# Patient Record
Sex: Male | Born: 1995 | Race: Black or African American | Hispanic: No | Marital: Single | Smoking: Never smoker
Health system: Southern US, Community
[De-identification: ages and names within clinical notes are randomized; demographics above are authoritative.]

## PROBLEM LIST (undated history)

## (undated) DIAGNOSIS — F909 Attention-deficit hyperactivity disorder, unspecified type: Secondary | ICD-10-CM

---
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
## 2018-08-20 ENCOUNTER — Other Ambulatory Visit: Payer: Self-pay

## 2018-08-20 ENCOUNTER — Encounter (HOSPITAL_COMMUNITY): Payer: Self-pay | Admitting: Emergency Medicine

## 2018-08-20 ENCOUNTER — Emergency Department (HOSPITAL_COMMUNITY): Payer: Managed Care, Other (non HMO)

## 2018-08-20 ENCOUNTER — Telehealth: Payer: Self-pay | Admitting: *Deleted

## 2018-08-20 ENCOUNTER — Emergency Department (HOSPITAL_COMMUNITY)
Admission: EM | Admit: 2018-08-20 | Discharge: 2018-08-20 | Disposition: A | Discharge status: Home / Self Care | Payer: Managed Care, Other (non HMO) | Attending: Emergency Medicine | Admitting: Emergency Medicine

## 2018-08-20 DIAGNOSIS — Y999 Unspecified external cause status: Secondary | ICD-10-CM | POA: Insufficient documentation

## 2018-08-20 DIAGNOSIS — Y929 Unspecified place or not applicable: Secondary | ICD-10-CM | POA: Insufficient documentation

## 2018-08-20 DIAGNOSIS — S01512A Laceration without foreign body of oral cavity, initial encounter: Secondary | ICD-10-CM

## 2018-08-20 DIAGNOSIS — S0081XA Abrasion of other part of head, initial encounter: Secondary | ICD-10-CM

## 2018-08-20 DIAGNOSIS — S50311A Abrasion of right elbow, initial encounter: Secondary | ICD-10-CM | POA: Diagnosis not present

## 2018-08-20 DIAGNOSIS — S0031XA Abrasion of nose, initial encounter: Secondary | ICD-10-CM | POA: Insufficient documentation

## 2018-08-20 DIAGNOSIS — S022XXA Fracture of nasal bones, initial encounter for closed fracture: Secondary | ICD-10-CM

## 2018-08-20 DIAGNOSIS — Y9389 Activity, other specified: Secondary | ICD-10-CM | POA: Diagnosis not present

## 2018-08-20 HISTORY — DX: Attention-deficit hyperactivity disorder, unspecified type: F90.9

## 2018-08-20 MED ORDER — HYDROCODONE-ACETAMINOPHEN 5-325 MG PO TABS
1.0000 | ORAL_TABLET | Freq: Once | ORAL | Status: AC
  Administered 2018-08-20: 1 via ORAL
  Filled 2018-08-20: qty 1

## 2018-08-20 MED ORDER — AMOXICILLIN 500 MG PO CAPS: 500.0000 mg | ORAL_CAPSULE | Freq: Three times a day (TID) | ORAL | 0 refills | Status: DC

## 2018-08-20 MED ORDER — HYDROCODONE-ACETAMINOPHEN 5-325 MG PO TABS: 1.0000 | ORAL_TABLET | ORAL | 0 refills | Status: DC | PRN

## 2018-08-20 MED ORDER — IBUPROFEN 800 MG PO TABS
800.0000 mg | ORAL_TABLET | Freq: Once | ORAL | Status: AC
  Administered 2018-08-20: 800 mg via ORAL
  Filled 2018-08-20: qty 1

## 2018-08-20 NOTE — Telephone Encounter (Signed)
TOC CM attempted to have pt's Rx switched [facility] home. [facility]. They can transfer Amoxicillin but not narcotic. Explained to pt and [facility] [address] [facility]s. Added his pharmacy to his profile. Pt will call and schedule an appt with specialist Dr Redmond Baseman. Jonnie Finner RN CCM Case Mgmt phone [phone]

## 2018-08-20 NOTE — ED Notes (Signed)
Pt sister Claudette Head [phone] or [phone]

## 2018-08-20 NOTE — Discharge Instructions (Addendum)
Use a solution of half water, half peroxide to rinse out your mouth after you eat meals and every few hours during the day.
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
## 2018-08-20 NOTE — ED Notes (Signed)
Wound care given
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
## 2019-11-15 ENCOUNTER — Ambulatory Visit: Payer: Managed Care, Other (non HMO)

## 2019-11-24 DIAGNOSIS — F411 Generalized anxiety disorder: Secondary | ICD-10-CM | POA: Insufficient documentation

## 2019-11-24 DIAGNOSIS — D72820 Lymphocytosis (symptomatic): Secondary | ICD-10-CM | POA: Insufficient documentation

## 2020-02-25 ENCOUNTER — Encounter (HOSPITAL_BASED_OUTPATIENT_CLINIC_OR_DEPARTMENT_OTHER): Payer: Self-pay | Admitting: Emergency Medicine

## 2020-02-25 ENCOUNTER — Emergency Department (HOSPITAL_BASED_OUTPATIENT_CLINIC_OR_DEPARTMENT_OTHER): Payer: BC Managed Care – PPO

## 2020-02-25 ENCOUNTER — Emergency Department (HOSPITAL_BASED_OUTPATIENT_CLINIC_OR_DEPARTMENT_OTHER)
Admission: EM | Admit: 2020-02-25 | Discharge: 2020-02-25 | Disposition: A | Discharge status: Home / Self Care | Payer: BC Managed Care – PPO | Attending: Emergency Medicine | Admitting: Emergency Medicine

## 2020-02-25 ENCOUNTER — Other Ambulatory Visit: Payer: Self-pay

## 2020-02-25 DIAGNOSIS — M79671 Pain in right foot: Secondary | ICD-10-CM | POA: Insufficient documentation

## 2020-02-25 DIAGNOSIS — Y92410 Unspecified street and highway as the place of occurrence of the external cause: Secondary | ICD-10-CM | POA: Diagnosis not present

## 2020-02-25 DIAGNOSIS — M79672 Pain in left foot: Secondary | ICD-10-CM

## 2020-02-25 DIAGNOSIS — S7012XA Contusion of left thigh, initial encounter: Secondary | ICD-10-CM | POA: Insufficient documentation

## 2020-02-25 DIAGNOSIS — S0990XA Unspecified injury of head, initial encounter: Secondary | ICD-10-CM | POA: Diagnosis not present

## 2020-02-25 DIAGNOSIS — M79652 Pain in left thigh: Secondary | ICD-10-CM

## 2020-02-25 DIAGNOSIS — S79922A Unspecified injury of left thigh, initial encounter: Secondary | ICD-10-CM | POA: Diagnosis present

## 2020-02-25 MED ORDER — CYCLOBENZAPRINE HCL 10 MG PO TABS: 10.0000 mg | ORAL_TABLET | Freq: Every day | ORAL | 0 refills | Status: AC
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
## 2020-02-25 NOTE — Discharge Instructions (Signed)
I recommend a combination of tylenol and ibuprofen for management of your pain. You can take a low dose of both at the same time. I recommend 325 mg of Tylenol combined with 400 mg of ibuprofen. This is one regular Tylenol and two regular ibuprofen. You can take these 2-3 times for day for your pain. Please try to take these medications with a small amount of food as well to prevent upsetting your stomach.  Also, please consider topical pain relieving creams such as Voltaran Gel, BioFreeze, or Icy Hot. There is also a pain relieving cream made by [employer]. You should be able to find all of these at your local pharmacy.   I am prescribing you a strong muscle relaxer called flexeril. Please only take this medication once in the evening with dinner. This medication can make you quite drowsy. Do not mix it with alcohol. Do not drive a vehicle after taking it.   Please return to the ED with any new or worsening symptoms. It was a pleasure to meet you.

## 2020-05-25 ENCOUNTER — Encounter (HOSPITAL_BASED_OUTPATIENT_CLINIC_OR_DEPARTMENT_OTHER): Payer: Self-pay | Admitting: Emergency Medicine

## 2020-05-25 ENCOUNTER — Other Ambulatory Visit: Payer: Self-pay

## 2020-05-25 ENCOUNTER — Emergency Department (HOSPITAL_BASED_OUTPATIENT_CLINIC_OR_DEPARTMENT_OTHER): Payer: BC Managed Care – PPO

## 2020-05-25 ENCOUNTER — Emergency Department (HOSPITAL_BASED_OUTPATIENT_CLINIC_OR_DEPARTMENT_OTHER)
Admission: EM | Admit: 2020-05-25 | Discharge: 2020-05-25 | Disposition: A | Discharge status: Home / Self Care | Payer: BC Managed Care – PPO | Attending: Emergency Medicine | Admitting: Emergency Medicine

## 2020-05-25 DIAGNOSIS — R059 Cough, unspecified: Secondary | ICD-10-CM | POA: Diagnosis present

## 2020-05-25 DIAGNOSIS — J069 Acute upper respiratory infection, unspecified: Secondary | ICD-10-CM | POA: Diagnosis not present

## 2020-05-25 LAB — GROUP A STREP BY PCR: Group A Strep by PCR: NOT DETECTED

## 2020-05-25 MED ORDER — BENZONATATE 100 MG PO CAPS: 100.0000 mg | ORAL_CAPSULE | Freq: Three times a day (TID) | ORAL | 0 refills | Status: DC

## 2020-05-25 NOTE — Discharge Instructions (Addendum)
Take the medications as needed for cough.  Follow-up With a primary care doctor if the symptoms do not resolve in the next week

## 2020-05-25 NOTE — ED Provider Notes (Signed)
[facility] EMERGENCY DEPARTMENT Provider Note   CSN: [id number] Arrival date & time: 05/25/20  0734     History Chief Complaint  Patient presents with  . Cough  . Sore Throat    Randy Higgins is a 25 y.o. male.  HPI   Patient presented to the ED for evaluation of a dry cough that started yesterday.  Patient states he started coughing last night.  The symptoms persisted this morning.  He feels a tightness in his chest.  He has not had any fevers.  The cough has been nonproductive.  Patient is also worried about the possibility of strep throat as he has had a number of times in the past or he does not have a bad sore throat at this time.  He did do home COVID test and it was negative.  He has been vaccinated for COVID  Past Medical History:  Diagnosis Date  . ADHD     There are no problems to display for this patient.   History reviewed. No pertinent surgical history.     No family history on file.  Social History   Tobacco Use  . Smoking status: Never Smoker  . Smokeless tobacco: Never Used  Substance Use Topics  . Alcohol use: Yes  . Drug use: Yes    Types: Marijuana    Home Medications Prior to Admission medications   Medication Sig Start Date End Date Taking? Authorizing Provider  benzonatate (TESSALON) 100 MG capsule Take 1 capsule (100 mg total) by mouth every 8 (eight) hours. 05/25/20  Yes Linwood Dibbles, MD  amoxicillin (AMOXIL) 500 MG capsule Take 1 capsule (500 mg total) by mouth 3 (three) times daily. 08/20/18   Gilda Crease, MD  HYDROcodone-acetaminophen (NORCO/VICODIN) 5-325 MG tablet Take 1 tablet by mouth every 4 (four) hours as needed for moderate pain. 08/20/18   Gilda Crease, MD    Allergies    Patient has no known allergies.  Review of Systems   Review of Systems  All other systems reviewed and are negative.   Physical Exam Updated Vital Signs BP 136/84 (BP Location: Right Arm)   Pulse 96   Temp 98.6 F (37  C) (Oral)   Resp 16   Ht 1.651 m (5\' 5" )   Wt 113.4 kg   SpO2 98%   BMI 41.60 kg/m   Physical Exam Vitals and nursing note reviewed.  Constitutional:      General: He is not in acute distress.    Appearance: He is well-developed.  HENT:     Head: Normocephalic and atraumatic.     Right Ear: External ear normal.     Left Ear: External ear normal.     Nose: No congestion or rhinorrhea.     Mouth/Throat:     Mouth: Mucous membranes are moist.     Pharynx: Oropharynx is clear. No oropharyngeal exudate or uvula swelling.  Eyes:     General: No scleral icterus.       Right eye: No discharge.        Left eye: No discharge.     Conjunctiva/sclera: Conjunctivae normal.  Neck:     Trachea: No tracheal deviation.  Cardiovascular:     Rate and Rhythm: Normal rate and regular rhythm.  Pulmonary:     Effort: Pulmonary effort is normal. No respiratory distress.     Breath sounds: Normal breath sounds. No stridor. No wheezing or rhonchi.  Abdominal:     General: There is  no distension.  Musculoskeletal:        General: No swelling or deformity.     Cervical back: Neck supple.  Skin:    General: Skin is warm and dry.     Findings: No rash.  Neurological:     Mental Status: He is alert.     Cranial Nerves: Cranial nerve deficit: no gross deficits.     ED Results / Procedures / Treatments   Labs (all labs ordered are listed, but only abnormal results are displayed) Labs Reviewed  GROUP A STREP BY PCR    EKG None  Radiology DG Chest 2 View  Result Date: 05/25/2020 CLINICAL DATA:  Cough. EXAM: CHEST - 2 VIEW COMPARISON:  None. FINDINGS: The heart size and mediastinal contours are within normal limits. Both lungs are clear. The visualized skeletal structures are unremarkable. IMPRESSION: No active cardiopulmonary disease. Electronically Signed   By: Lupita Raider M.D.   On: 05/25/2020 08:21    Procedures Procedures   Medications Ordered in ED Medications - No data to  display  ED Course  I have reviewed the triage vital signs and the nursing notes.  Pertinent labs & imaging results that were available during my care of the patient were reviewed by me and considered in my medical decision making (see chart for details).  Clinical Course as of 05/25/20 0846  Fri May 25, 2020  [id number] Chest x-ray is negative.  Strep test is negative. [JK]    Clinical Course User Index [JK] Linwood Dibbles, MD   MDM Rules/Calculators/A&P                          Strep test is negative and chest x-ray is normal.  Patient has also tested negative for COVID and he has been vaccinated.  Symptoms likely consistent with viral URI Final Clinical Impression(s) / ED Diagnoses Final diagnoses:  Upper respiratory tract infection, unspecified type    Rx / DC Orders ED Discharge Orders         Ordered    benzonatate (TESSALON) 100 MG capsule  Every 8 hours        05/25/20 0845           Linwood Dibbles, MD 05/25/20 [phone]

## 2020-05-25 NOTE — ED Triage Notes (Signed)
Reports dry cough started yesterday. No fever. Headache .

## 2020-06-08 ENCOUNTER — Encounter (HOSPITAL_COMMUNITY): Payer: Self-pay | Admitting: Emergency Medicine

## 2020-06-08 ENCOUNTER — Emergency Department (HOSPITAL_COMMUNITY)
Admission: EM | Admit: 2020-06-08 | Discharge: 2020-06-08 | Disposition: A | Discharge status: Home / Self Care | Payer: BC Managed Care – PPO | Attending: Emergency Medicine | Admitting: Emergency Medicine

## 2020-06-08 ENCOUNTER — Other Ambulatory Visit: Payer: Self-pay

## 2020-06-08 DIAGNOSIS — J039 Acute tonsillitis, unspecified: Secondary | ICD-10-CM | POA: Diagnosis not present

## 2020-06-08 DIAGNOSIS — J029 Acute pharyngitis, unspecified: Secondary | ICD-10-CM | POA: Diagnosis present

## 2020-06-08 MED ORDER — DEXAMETHASONE SODIUM PHOSPHATE 10 MG/ML IJ SOLN
10.0000 mg | Freq: Once | INTRAMUSCULAR | Status: AC
  Administered 2020-06-08: 10 mg via INTRAMUSCULAR
  Filled 2020-06-08: qty 1

## 2020-06-08 NOTE — ED Triage Notes (Signed)
Patient presents with a sore throat and drainage. The pain kept him from being able to sleep. Since his attempt to get and appointment with an ENT MD today was unsuccessful, he came here.

## 2020-06-08 NOTE — ED Provider Notes (Signed)
[facility]-EMERGENCY DEPT Provider Note   CSN: [id number] Arrival date & time: 06/08/20  1322     History Chief Complaint  Patient presents with  . Sore Throat    Randy Higgins is a 25 y.o. male.  25 year old male presents emergency room with ongoing sore throat x2 weeks.  Patient was seen in urgent care 2 weeks ago, given prescription for amoxicillin with negative mono and strep test.  Patient followed up with his PCP who referred him to ENT, gave him a prescription for Augmentin and prednisone.  Patient states that he was having difficulty sleeping last night due to difficulty breathing.  Patient states this is since resolved, no respiratory difficulties at this time, is able to tolerate secretions without difficulty.        Past Medical History:  Diagnosis Date  . ADHD     There are no problems to display for this patient.   History reviewed. No pertinent surgical history.     History reviewed. No pertinent family history.  Social History   Tobacco Use  . Smoking status: Never Smoker  . Smokeless tobacco: Never Used  Substance Use Topics  . Alcohol use: Yes  . Drug use: Yes    Types: Marijuana    Home Medications Prior to Admission medications   Medication Sig Start Date End Date Taking? Authorizing Provider  amoxicillin (AMOXIL) 500 MG capsule Take 1 capsule (500 mg total) by mouth 3 (three) times daily. 08/20/18   Gilda Crease, MD  benzonatate (TESSALON) 100 MG capsule Take 1 capsule (100 mg total) by mouth every 8 (eight) hours. 05/25/20   Linwood Dibbles, MD  HYDROcodone-acetaminophen (NORCO/VICODIN) 5-325 MG tablet Take 1 tablet by mouth every 4 (four) hours as needed for moderate pain. 08/20/18   Gilda Crease, MD    Allergies    Patient has no known allergies.  Review of Systems   Review of Systems  Constitutional: Negative for fever.  HENT: Positive for sore throat and voice change. Negative for congestion and  trouble swallowing.   Respiratory: Positive for cough.   Gastrointestinal: Negative for nausea and vomiting.  Musculoskeletal: Negative for arthralgias and myalgias.  Skin: Negative for rash and wound.  Allergic/Immunologic: Negative for immunocompromised state.  Neurological: Negative for headaches.  Hematological: Negative for adenopathy.  All other systems reviewed and are negative.   Physical Exam Updated Vital Signs BP 131/83   Pulse (!) 105   Temp 98.1 F (36.7 C) (Oral)   Resp 18   Ht 5\' 5"  (1.651 m)   Wt 113.4 kg   SpO2 96%   BMI 41.60 kg/m   Physical Exam Vitals and nursing note reviewed.  Constitutional:      General: He is not in acute distress.    Appearance: He is well-developed. He is not diaphoretic.  HENT:     Head: Normocephalic and atraumatic.     Jaw: No trismus.     Nose: No congestion.     Mouth/Throat:     Mouth: Mucous membranes are moist.     Pharynx: Uvula midline. No pharyngeal swelling, oropharyngeal exudate or uvula swelling.     Tonsils: No tonsillar exudate. 1+ on the right. 2+ on the left.  Eyes:     Conjunctiva/sclera: Conjunctivae normal.  Cardiovascular:     Rate and Rhythm: Normal rate and regular rhythm.     Heart sounds: Normal heart sounds.  Pulmonary:     Effort: Pulmonary effort is normal.  Breath sounds: Normal breath sounds.  Musculoskeletal:     Cervical back: Neck supple.  Lymphadenopathy:     Cervical: No cervical adenopathy.  Skin:    General: Skin is warm and dry.  Neurological:     Mental Status: He is alert and oriented to person, place, and time.  Psychiatric:        Behavior: Behavior normal.     ED Results / Procedures / Treatments   Labs (all labs ordered are listed, but only abnormal results are displayed) Labs Reviewed - No data to display  EKG None  Radiology No results found.  Procedures Procedures   Medications Ordered in ED Medications  dexamethasone (DECADRON) injection 10 mg (has  no administration in time range)    ED Course  I have reviewed the triage vital signs and the nursing notes.  Pertinent labs & imaging results that were available during my care of the patient were reviewed by me and considered in my medical decision making (see chart for details).  Clinical Course as of 06/08/20 1440  Fri Jun 08, 2020  3356 25 year old male with sore throat x2 weeks, no improvement with amoxicillin. On exam, has enlarged left tonsil without exudate, uvula is midline, voice change but is tolerating secretions.  Patient is afebrile. As symptoms seem to be improving at this time, recommend patient continue with plan for Augmentin from PCP, will give dose of Decadron in the emergency room today, recommend home to rest and hydrate.  Motrin Tylenol as needed.  Patient to follow-up with ENT next week, advised to call today to schedule appointment.  He is advised return the emergency room for any worsening of his condition at any time. [LM]    Clinical Course User Index [LM] Alden Hipp   MDM Rules/Calculators/A&P                          Final Clinical Impression(s) / ED Diagnoses Final diagnoses:  Tonsillitis    Rx / DC Orders ED Discharge Orders    None       Alden Hipp 06/08/20 1440    Linwood Dibbles, MD 06/09/20 [phone]

## 2020-06-08 NOTE — Discharge Instructions (Addendum)
Start prednisone tomorrow.  Start the Augmentin today. Take Motrin and Tylenol as needed as directed.  Home to rest, push hydrating fluids. Call ENT today to schedule appointment for next week. Return to the emergency room at anytime for worsening or concerning symptoms.

## 2021-05-14 ENCOUNTER — Ambulatory Visit (INDEPENDENT_AMBULATORY_CARE_PROVIDER_SITE_OTHER): Payer: Medicaid Other | Admitting: Bariatrics

## 2021-05-14 ENCOUNTER — Encounter (INDEPENDENT_AMBULATORY_CARE_PROVIDER_SITE_OTHER): Payer: Self-pay | Admitting: Bariatrics

## 2021-05-14 VITALS — BP 124/78 | HR 72 | Temp 98.3°F | Ht 66.0 in | Wt 252.0 lb

## 2021-05-14 DIAGNOSIS — E668 Other obesity: Secondary | ICD-10-CM

## 2021-05-14 DIAGNOSIS — R7309 Other abnormal glucose: Secondary | ICD-10-CM

## 2021-05-14 DIAGNOSIS — R632 Polyphagia: Secondary | ICD-10-CM | POA: Diagnosis not present

## 2021-05-14 DIAGNOSIS — R0602 Shortness of breath: Secondary | ICD-10-CM

## 2021-05-14 DIAGNOSIS — E559 Vitamin D deficiency, unspecified: Secondary | ICD-10-CM | POA: Diagnosis not present

## 2021-05-14 DIAGNOSIS — Z1331 Encounter for screening for depression: Secondary | ICD-10-CM

## 2021-05-14 DIAGNOSIS — Z6841 Body Mass Index (BMI) 40.0 and over, adult: Secondary | ICD-10-CM

## 2021-05-14 DIAGNOSIS — R5383 Other fatigue: Secondary | ICD-10-CM | POA: Diagnosis not present

## 2021-05-14 DIAGNOSIS — Z0000 Encounter for general adult medical examination without abnormal findings: Secondary | ICD-10-CM

## 2021-05-15 ENCOUNTER — Encounter (INDEPENDENT_AMBULATORY_CARE_PROVIDER_SITE_OTHER): Payer: Self-pay | Admitting: Bariatrics

## 2021-05-15 DIAGNOSIS — E559 Vitamin D deficiency, unspecified: Secondary | ICD-10-CM | POA: Insufficient documentation

## 2021-05-15 DIAGNOSIS — E7849 Other hyperlipidemia: Secondary | ICD-10-CM | POA: Insufficient documentation

## 2021-05-15 DIAGNOSIS — E7800 Pure hypercholesterolemia, unspecified: Secondary | ICD-10-CM | POA: Insufficient documentation

## 2021-05-15 LAB — COMPREHENSIVE METABOLIC PANEL
ALT: 24 IU/L (ref 0–44)
AST: 20 IU/L (ref 0–40)
Albumin/Globulin Ratio: 1.4 (ref 1.2–2.2)
Albumin: 4.6 g/dL (ref 4.1–5.2)
Alkaline Phosphatase: 115 IU/L (ref 44–121)
BUN/Creatinine Ratio: 12 (ref 9–20)
BUN: 12 mg/dL (ref 6–20)
Bilirubin Total: 0.4 mg/dL (ref 0.0–1.2)
CO2: 23 mmol/L (ref 20–29)
Calcium: 9.3 mg/dL (ref 8.7–10.2)
Chloride: 101 mmol/L (ref 96–106)
Creatinine, Ser: 0.97 mg/dL (ref 0.76–1.27)
Globulin, Total: 3.2 g/dL (ref 1.5–4.5)
Glucose: 82 mg/dL (ref 70–99)
Potassium: 4.4 mmol/L (ref 3.5–5.2)
Sodium: 139 mmol/L (ref 134–144)
Total Protein: 7.8 g/dL (ref 6.0–8.5)
eGFR: 112 mL/min/{1.73_m2} (ref >59)

## 2021-05-15 LAB — VITAMIN D 25 HYDROXY (VIT D DEFICIENCY, FRACTURES): Vit D, 25-Hydroxy: 9.4 ng/mL — ABNORMAL LOW (ref 30.0–100.0)

## 2021-05-15 LAB — TSH+T4F+T3FREE
Free T4: 1.32 ng/dL (ref 0.82–1.77)
T3, Free: 3.3 pg/mL (ref 2.0–4.4)
TSH: 1.09 u[IU]/mL (ref 0.450–4.500)

## 2021-05-15 LAB — LIPID PANEL WITH LDL/HDL RATIO
Cholesterol, Total: 216 mg/dL — ABNORMAL HIGH (ref 100–199)
HDL: 33 mg/dL — ABNORMAL LOW (ref >39)
LDL Chol Calc (NIH): 172 mg/dL — ABNORMAL HIGH (ref 0–99)
LDL/HDL Ratio: 5.2 ratio — ABNORMAL HIGH (ref 0.0–3.6)
Triglycerides: 62 mg/dL (ref 0–149)
VLDL Cholesterol Cal: 11 mg/dL (ref 5–40)

## 2021-05-15 LAB — INSULIN, RANDOM: INSULIN: 13.1 u[IU]/mL (ref 2.6–24.9)

## 2021-05-15 LAB — HEMOGLOBIN A1C
Est. average glucose Bld gHb Est-mCnc: 91 mg/dL
Hgb A1c MFr Bld: 4.8 % (ref 4.8–5.6)

## 2021-05-15 NOTE — Progress Notes (Signed)
? ? ? ?Chief Complaint:  ? ?OBESITY ?Randy Higgins (MR# [id number]) is a 26 y.o. male who presents for evaluation and treatment of obesity and related comorbidities. Current BMI is Body mass index is 40.67 kg/m?Marland [employer] Randy Higgins has been struggling with his weight for many years and has been unsuccessful in either losing weight, maintaining weight loss, or reaching his healthy weight goal. ? ?Randy Higgins states that he likes to cook.  ? ?Randy Higgins is currently in the action stage of change and ready to dedicate time achieving and maintaining a healthier weight. Randy Higgins is interested in becoming our patient and working on intensive lifestyle modifications including (but not limited to) diet and exercise for weight loss. ? ?Randy Higgins's habits were reviewed today and are as follows: His family eats meals together, he thinks his family will eat healthier with him, his desired weight loss is 67 pounds, he has been heavy most of his life, he started gaining weight in college, his heaviest weight ever was 255 pounds, he is a picky eater and doesn't like to eat healthier foods, he has significant food cravings issues, he snacks frequently in the evenings, he skips meals frequently, he is frequently drinking liquids with calories, he frequently makes poor food choices, he frequently eats larger portions than normal, and he struggles with emotional eating. ? ?Depression Screen ?Randy Higgins's Food and Mood (modified PHQ-9) score was 12. ? ? ?  05/14/2021  ?  7:40 AM  ?Depression screen PHQ 2/9  ?Decreased Interest 2  ?Down, Depressed, Hopeless 1  ?PHQ - 2 Score 3  ?Altered sleeping 3  ?Tired, decreased energy 3  ?Change in appetite 1  ?Feeling bad or failure about yourself  1  ?Trouble concentrating 1  ?Moving slowly or fidgety/restless 0  ?Suicidal thoughts 0  ?PHQ-9 Score 12  ?Difficult doing work/chores Somewhat difficult  ? ?Subjective:  ? ?1. Other fatigue ?Randy Higgins will continue activities. Randy Higgins admits to daytime somnolence and  admits to waking up still tired. Patient has a history of symptoms of morning fatigue. Randy Higgins generally gets 5 or 6 hours of sleep per night, and states that he has difficulty falling asleep and generally restful sleep. Snoring are not present. Apneic episodes are not present. Epworth Sleepiness Score is 11.   ? ?2. SOB (shortness of breath) on exertion ?Randy Higgins notes increasing shortness of breath with exercising and seems to be worsening over time with weight gain. He notes getting out of breath sooner with activity than he used to. This has not gotten worse recently. Randy Higgins denies shortness of breath at rest or orthopnea.  ? ?3. Vitamin D deficiency ?Randy Higgins is not on Vitamin D currently.  ? ?4. Healthcare maintenance ?We discussed Healthcare maintenance today.  ? ?5. Polyphagia ?Randy Higgins will decrease larger portions. He will increase fruit. ? ?6. Elevated glucose ?Randy Higgins has a paternal history of elevated history. ? ?Assessment/Plan:  ? ?1. Other fatigue ?Randy Higgins will continue gradually increase activities. We will check thyroid panel and we will review EKG today. Randy Higgins does feel that his weight is causing his energy to be lower than it should be. Fatigue may be related to obesity, depression or many other causes. Labs will be ordered, and in the meanwhile, Randy Higgins will focus on self care including making healthy food choices, increasing physical activity and focusing on stress reduction.  ? ?- EKG 12-Lead ?- TSH+T4F+T3Free ? ?2. SOB (shortness of breath) on exertion ?Randy Higgins does feel that he gets out of breath more easily that he used to when he exercises.  Randy Higgins's shortness of breath appears to be obesity related and exercise induced. He has agreed to work on weight loss and gradually increase exercise to treat his exercise induced shortness of breath. Will continue to monitor closely.  ? ?3. Vitamin D deficiency ?Low Vitamin D level contributes to fatigue and are associated with obesity,  breast, and colon cancer. We will check Vitamin D 50,000 IU today and Randy Higgins will follow-up for routine testing of Vitamin D, at least 2-3 times per year to avoid over-replacement. ? ?- VITAMIN D 25 Hydroxy (Vit-D Deficiency, Fractures) ? ?4. Healthcare maintenance ?We will check Lipids, A1C, lipid panel, and insulin today.  ? ?- Lipid Panel With LDL/HDL Ratio ?- Insulin, random ?- Hemoglobin A1c ? ?5. Polyphagia ?Intensive lifestyle modifications are the first line treatment for this issue. We will check  A1C, insulin, CMP, and Lipids today. We discussed several lifestyle modifications today and he will continue to work on diet, exercise and weight loss efforts. Orders and follow up as documented in patient record. ? ?Counseling ?Polyphagia is excessive hunger. ?Causes can include: low blood sugars, hypERthyroidism, PMS, lack of sleep, stress, insulin resistance, diabetes, certain medications, and diets that are deficient in protein and fiber.   ? ?- Insulin, random ?- Hemoglobin A1c ?- Comprehensive metabolic panel ?- Lipid Panel With LDL/HDL Ratio ? ?6. Elevated glucose ?We will check A1C and insulin today.  ? ?- Insulin, random ?- Hemoglobin A1c ? ?7. Depression screen ?Randy Higgins had a positive depression screening. Depression is commonly associated with obesity and often results in emotional eating behaviors. We will monitor this closely and work on CBT to help improve the non-hunger eating patterns. Referral to Psychology may be required if no improvement is seen as he continues in our clinic.  ? ?8. Class 3 severe obesity with serious comorbidity and body mass index (BMI) of 40.0 to 44.9 in adult, unspecified obesity type (Randy Higgins) ?Randy Higgins is currently in the action stage of change and his goal is to continue with weight loss efforts. I recommend Randy Higgins begin the structured treatment plan as follows: ? ?He has agreed to the Category 3 Plan. ? ?Randy Higgins will continue meal planning and he will be mindful  eating. He will have a protein shake if he does not eat breakfast.  ? ?Exercise goals: No exercise has been prescribed at this time.  ? ?Behavioral modification strategies: increasing lean protein intake, decreasing simple carbohydrates, increasing vegetables, increasing water intake, decreasing eating out, no skipping meals, meal planning and cooking strategies, keeping healthy foods in the home, and planning for success. ? ?He was informed of the importance of frequent follow-up visits to maximize his success with intensive lifestyle modifications for his multiple health conditions. He was informed we would discuss his lab results at his next visit unless there is a critical issue that needs to be addressed sooner. Randy Higgins agreed to keep his next visit at the agreed upon time to discuss these results. ? ?Objective:  ? ?Blood pressure 124/78, pulse 72, temperature 98.3 ?F (36.8 ?C), height 5\' 6"  (1.676 m), weight 252 lb (114.3 kg), SpO2 98 %. Body mass index is 40.67 kg/m?. ? ?EKG: Normal sinus rhythm, rate 75 bpm. ? ?Indirect Calorimeter completed today shows a VO2 of 302 and a REE of 2088.  His calculated basal metabolic rate is [id number] thus his basal metabolic rate is worse than expected. ? ?General: Cooperative, alert, well developed, in no acute distress. ?HEENT: Conjunctivae and lids unremarkable. ?Cardiovascular: Regular rhythm.  ?Lungs: Normal work  of breathing. ?Neurologic: No focal deficits.  ? ?Lab Results  ?Component Value Date  ? CREATININE 0.97 05/14/2021  ? BUN 12 05/14/2021  ? NA 139 05/14/2021  ? K 4.4 05/14/2021  ? CL 101 05/14/2021  ? CO2 23 05/14/2021  ? ?Lab Results  ?Component Value Date  ? ALT 24 05/14/2021  ? AST 20 05/14/2021  ? ALKPHOS 115 05/14/2021  ? BILITOT 0.4 05/14/2021  ? ?Lab Results  ?Component Value Date  ? HGBA1C 4.8 05/14/2021  ? ?Lab Results  ?Component Value Date  ? INSULIN 13.1 05/14/2021  ? ?Lab Results  ?Component Value Date  ? TSH 1.090 05/14/2021  ? ?Lab Results   ?Component Value Date  ? CHOL 216 (H) 05/14/2021  ? HDL 33 (L) 05/14/2021  ? LDLCALC 172 (H) 05/14/2021  ? TRIG 62 05/14/2021  ? ?No results found for: WBC, HGB, HCT, MCV, PLT ?No results found for: IRON, TIBC, FERRITI

## 2021-05-16 ENCOUNTER — Encounter (INDEPENDENT_AMBULATORY_CARE_PROVIDER_SITE_OTHER): Payer: Self-pay | Admitting: Bariatrics

## 2021-05-28 ENCOUNTER — Encounter (INDEPENDENT_AMBULATORY_CARE_PROVIDER_SITE_OTHER): Payer: Self-pay | Admitting: Bariatrics

## 2021-05-28 ENCOUNTER — Ambulatory Visit (INDEPENDENT_AMBULATORY_CARE_PROVIDER_SITE_OTHER): Payer: Medicaid Other | Admitting: Bariatrics

## 2021-05-28 VITALS — BP 134/78 | HR 99 | Temp 98.6°F | Ht 66.0 in | Wt 251.0 lb

## 2021-05-28 DIAGNOSIS — E7800 Pure hypercholesterolemia, unspecified: Secondary | ICD-10-CM | POA: Diagnosis not present

## 2021-05-28 DIAGNOSIS — Z6841 Body Mass Index (BMI) 40.0 and over, adult: Secondary | ICD-10-CM

## 2021-05-28 DIAGNOSIS — E8881 Metabolic syndrome: Secondary | ICD-10-CM | POA: Diagnosis not present

## 2021-05-28 DIAGNOSIS — E669 Obesity, unspecified: Secondary | ICD-10-CM | POA: Diagnosis not present

## 2021-05-28 DIAGNOSIS — E559 Vitamin D deficiency, unspecified: Secondary | ICD-10-CM

## 2021-05-28 MED ORDER — VITAMIN D (ERGOCALCIFEROL) 1.25 MG (50000 UNIT) PO CAPS: 50000.0000 [IU] | ORAL_CAPSULE | ORAL | 0 refills | Status: DC

## 2021-05-28 MED ORDER — METFORMIN HCL 500 MG PO TABS: 500.0000 mg | ORAL_TABLET | Freq: Every day | ORAL | 0 refills | Status: DC

## 2021-06-05 NOTE — Progress Notes (Signed)
? ? ? ?Chief Complaint:  ? ?OBESITY ?Randy Higgins is here to discuss his progress with his obesity treatment plan along with follow-up of his obesity related diagnoses. Randy Higgins is on the Category 3 Plan and states he is following his eating plan approximately 0% of the time. Randy Higgins states he is not currently exercising. ? ?Today's visit was #: 2 ?Starting weight: 252 lbs ?Starting date: 4/44/2023 ?Today's weight: 251 lbs ?Today's date: 05/28/2021 ?Total lbs lost to date: 1 lb ?Total lbs lost since last in-office visit: 1 lb ? ?Interim History: Randy Higgins is down 1 lb since his last visit.  He is eating more healthier and making better choices. ? ?Subjective:  ? ?1. Vitamin D deficiency ?Randy Higgins's current Vitamin D level is 9.4 ? ?2. Insulin resistance, mild ?Randy Higgins's Insulin = 13.1, Hgb A1c = 4.8  ? ?3. Elevated cholesterol ?Randy Higgins's total Cholesterol = 216, HDL = 33, LDL = 6.2 ? ?Assessment/Plan:  ? ?1. Vitamin D deficiency ?Low Vitamin D level contributes to fatigue and are associated with obesity, breast, and colon cancer. He agrees to continue to take prescription Vitamin D @50 ,000 IU every week and will follow-up for routine testing of Vitamin D, at least 2-3 times per year to avoid over-replacement. ?- Vitamin D, Ergocalciferol, (DRISDOL) 1.25 MG (50000 UNIT) CAPS capsule; Take 1 capsule (50,000 Units total) by mouth every 7 (seven) days.  Dispense: 6 capsule; Refill: 0 ? ?2. Insulin resistance, mild ?Randy Higgins will keep carbohydrates low and increase protein. ?- metFORMIN (GLUCOPHAGE) 500 MG tablet; Take 1 tablet (500 mg total) by mouth daily with lunch.  Dispense: 30 tablet; Refill: 0 ? ?3. Elevated cholesterol ?Randy Higgins will start exercising and eating lean meats (eating out list provided). ? ?4. Obesity, Current BMI 40.6 ?Randy Higgins is currently in the action stage of change. As such, his goal is to continue with weight loss efforts. He has agreed to the Category 3 Plan.  ? ?Randy Higgins agreed to meal planning  and intentional eating. He will adhere to the plan.  ?Reviewed labs from 05/14/2021. ? ?Exercise goals: Discussed exercise and Randy Higgins will start walking. ? ?Behavioral modification strategies: increasing lean protein intake, decreasing simple carbohydrates, increasing vegetables, increasing water intake, decreasing eating out, no skipping meals, meal planning and cooking strategies, keeping healthy foods in the home, and planning for success. ? ?Randy Higgins has agreed to follow-up with our clinic in 2-3 weeks. He was informed of the importance of frequent follow-up visits to maximize his success with intensive lifestyle modifications for his multiple health conditions.  ? ? ?Objective:  ? ?Blood pressure 134/78, pulse 99, temperature 98.6 ?F (37 ?C), height 5\' 6"  (1.676 m), weight 251 lb (113.9 kg), SpO2 94 %. ?Body mass index is 40.51 kg/m?. ? ?General: Cooperative, alert, well developed, in no acute distress. ?HEENT: Conjunctivae and lids unremarkable. ?Cardiovascular: Regular rhythm.  ?Lungs: Normal work of breathing. ?Neurologic: No focal deficits.  ? ?Lab Results  ?Component Value Date  ? CREATININE 0.97 05/14/2021  ? BUN 12 05/14/2021  ? NA 139 05/14/2021  ? K 4.4 05/14/2021  ? CL 101 05/14/2021  ? CO2 23 05/14/2021  ? ?Lab Results  ?Component Value Date  ? ALT 24 05/14/2021  ? AST 20 05/14/2021  ? ALKPHOS 115 05/14/2021  ? BILITOT 0.4 05/14/2021  ? ?Lab Results  ?Component Value Date  ? HGBA1C 4.8 05/14/2021  ? ?Lab Results  ?Component Value Date  ? INSULIN 13.1 05/14/2021  ? ?Lab Results  ?Component Value Date  ? TSH 1.090 05/14/2021  ? ?  Lab Results  ?Component Value Date  ? CHOL 216 (H) 05/14/2021  ? HDL 33 (L) 05/14/2021  ? LDLCALC 172 (H) 05/14/2021  ? TRIG 62 05/14/2021  ? ?Lab Results  ?Component Value Date  ? VD25OH 9.4 (L) 05/14/2021  ? ?No results found for: WBC, HGB, HCT, MCV, PLT ?No results found for: IRON, TIBC, FERRITIN ? ?Attestation Statements:  ? ?Reviewed by clinician on day of visit:  allergies, medications, problem list, medical history, surgical history, family history, social history, and previous encounter notes. ? ?I, Paulla Fore, CMA, am acting as transcriptionist for Dr. Corinna Capra, DO. ? ?I have reviewed the above documentation for accuracy and completeness, and I agree with the above. Corinna Capra, DO ? ?

## 2021-06-06 ENCOUNTER — Encounter (INDEPENDENT_AMBULATORY_CARE_PROVIDER_SITE_OTHER): Payer: Self-pay | Admitting: Bariatrics

## 2021-06-19 ENCOUNTER — Ambulatory Visit (INDEPENDENT_AMBULATORY_CARE_PROVIDER_SITE_OTHER): Payer: Medicaid Other | Admitting: Adult Health

## 2021-06-19 ENCOUNTER — Encounter (INDEPENDENT_AMBULATORY_CARE_PROVIDER_SITE_OTHER): Payer: Self-pay | Admitting: Adult Health

## 2021-06-19 VITALS — BP 129/72 | HR 79 | Temp 97.9°F | Ht 66.0 in | Wt 250.0 lb

## 2021-06-19 DIAGNOSIS — E669 Obesity, unspecified: Secondary | ICD-10-CM | POA: Diagnosis not present

## 2021-06-19 DIAGNOSIS — Z6841 Body Mass Index (BMI) 40.0 and over, adult: Secondary | ICD-10-CM

## 2021-06-19 DIAGNOSIS — E559 Vitamin D deficiency, unspecified: Secondary | ICD-10-CM | POA: Diagnosis not present

## 2021-06-19 DIAGNOSIS — R7303 Prediabetes: Secondary | ICD-10-CM

## 2021-06-19 MED ORDER — SEMAGLUTIDE-WEIGHT MANAGEMENT 0.25 MG/0.5ML ~~LOC~~ SOAJ: 0.2500 mg | SUBCUTANEOUS | 0 refills | Status: DC

## 2021-06-19 MED ORDER — VITAMIN D (ERGOCALCIFEROL) 1.25 MG (50000 UNIT) PO CAPS: ORAL_CAPSULE | ORAL | 0 refills | Status: DC

## 2021-06-25 NOTE — Progress Notes (Unsigned)
Chief Complaint:   OBESITY Randy Higgins is here to discuss his progress with his obesity treatment plan along with follow-up of his obesity related diagnoses. Randy Higgins is on the Category 3 Plan and states he is following his eating plan approximately 80% of the time. Randy Higgins states he is walking for 60 to 120 minutes 3 times per week.  Today's visit was #: 3 Starting weight: 252 lbs Starting date: 4/44/2023 Today's weight: 250 lbs Today's date: 06/19/21 Total lbs lost to date: 2 lbs Total lbs lost since last in-office visit: -1  Interim History: He has decreased sodium, fried foods, fast foods, not following prescribed category 3 meal plan. He is also reducing soda, high sugar juices.  Subjective:   1. Vitamin D deficiency 05/14/2021 vitamin D level-9.9-severely low. He endorses increased energy since starting ergocalciferol once weekly.  2. Prediabetes Metformin 500 mg once daily-severe diarrhea, unable to tolerate. He denies family history of medullary thyroid cancer or personal history of pancreatitis.  Assessment/Plan:   1. Vitamin D deficiency Refill and increase dose: - Vitamin D, Ergocalciferol, (DRISDOL) 1.25 MG (50000 UNIT) CAPS capsule; 1 cap twice week  Dispense: 8 capsule; Refill: 0  2. Prediabetes Stop metformin. Start [employer].  3. Obesity, Current BMI 40.4 Randy Higgins is currently in the action stage of change. As such, his goal is to continue with weight loss efforts. He has agreed to the Category 3 Plan.   Handout: Category 3 meal, additional breakfast options. Start Wegovy 0.25 mg once weekly, dispense 2 mL, 0 refills.  Exercise goals: as is.  Behavioral modification strategies: increasing lean protein intake, decreasing simple carbohydrates, meal planning and cooking strategies, keeping healthy foods in the home, and planning for success.  Randy Higgins has agreed to follow-up with our clinic in 3 weeks. He was informed of the importance of frequent follow-up  visits to maximize his success with intensive lifestyle modifications for his multiple health conditions.   Objective:   Blood pressure 129/72, pulse 79, temperature 97.9 F (36.6 C), height 5\' 6"  (1.676 m), weight 250 lb (113.4 kg), SpO2 99 %. Body mass index is 40.35 kg/m.  General: Cooperative, alert, well developed, in no acute distress. HEENT: Conjunctivae and lids unremarkable. Cardiovascular: Regular rhythm.  Lungs: Normal work of breathing. Neurologic: No focal deficits.   Lab Results  Component Value Date   CREATININE 0.97 05/14/2021   BUN 12 05/14/2021   NA 139 05/14/2021   K 4.4 05/14/2021   CL 101 05/14/2021   CO2 23 05/14/2021   Lab Results  Component Value Date   ALT 24 05/14/2021   AST 20 05/14/2021   ALKPHOS 115 05/14/2021   BILITOT 0.4 05/14/2021   Lab Results  Component Value Date   HGBA1C 4.8 05/14/2021   Lab Results  Component Value Date   INSULIN 13.1 05/14/2021   Lab Results  Component Value Date   TSH 1.090 05/14/2021   Lab Results  Component Value Date   CHOL 216 (H) 05/14/2021   HDL 33 (L) 05/14/2021   LDLCALC 172 (H) 05/14/2021   TRIG 62 05/14/2021   Lab Results  Component Value Date   VD25OH 9.4 (L) 05/14/2021   No results found for: WBC, HGB, HCT, MCV, PLT No results found for: IRON, TIBC, FERRITIN   Attestation Statements:   Reviewed by clinician on day of visit: allergies, medications, problem list, medical history, surgical history, family history, social history, and previous encounter notes.  I, 07/14/2021, FNP, am acting as Jesse Sans  for William Hamburger, NP.   I have reviewed the above documentation for accuracy and completeness, and I agree with the above. -  ***

## 2021-06-26 ENCOUNTER — Telehealth (INDEPENDENT_AMBULATORY_CARE_PROVIDER_SITE_OTHER): Payer: Self-pay | Admitting: Adult Health

## 2021-06-26 ENCOUNTER — Encounter (INDEPENDENT_AMBULATORY_CARE_PROVIDER_SITE_OTHER): Payer: Self-pay | Admitting: Adult Health

## 2021-06-26 ENCOUNTER — Encounter (INDEPENDENT_AMBULATORY_CARE_PROVIDER_SITE_OTHER): Payer: Self-pay

## 2021-06-26 DIAGNOSIS — R7303 Prediabetes: Secondary | ICD-10-CM | POA: Insufficient documentation

## 2021-06-26 NOTE — Telephone Encounter (Signed)
Randy Higgins - Prior authorization denied for [employer]. Per insurance:The requested drug is being used for weight loss. Drugs used for this reason are not covered under your plan. Patient has Medicaid. Medicaid does not cover any weight loss medications. Patient sent denial message via mychart.

## 2021-06-26 NOTE — Telephone Encounter (Signed)
PT requested PA update. No message in chart at this time.

## 2021-06-26 NOTE — Telephone Encounter (Signed)
Patient has been sent mychart message in reference to [facility] prior authorization.

## 2021-06-26 NOTE — Telephone Encounter (Signed)
I just received message in covermymeds. I have sent patient a message in mychart. Thanks!

## 2021-06-26 NOTE — Telephone Encounter (Signed)
Pt called checking status of RX written & sent to pharmacy on 06/19/21. Please advise pt on progress and timeline for obtaining authorization from insurance for: Semaglutide- weight management 0.25 MG.

## 2021-06-28 ENCOUNTER — Other Ambulatory Visit (INDEPENDENT_AMBULATORY_CARE_PROVIDER_SITE_OTHER): Payer: Self-pay | Admitting: Bariatrics

## 2021-06-28 DIAGNOSIS — E8881 Metabolic syndrome: Secondary | ICD-10-CM

## 2021-07-08 ENCOUNTER — Encounter (INDEPENDENT_AMBULATORY_CARE_PROVIDER_SITE_OTHER): Payer: Self-pay | Admitting: Bariatrics

## 2021-07-08 ENCOUNTER — Ambulatory Visit (INDEPENDENT_AMBULATORY_CARE_PROVIDER_SITE_OTHER): Payer: Medicaid Other | Admitting: Bariatrics

## 2021-07-08 VITALS — BP 120/72 | HR 82 | Temp 97.4°F | Ht 66.0 in | Wt 251.0 lb

## 2021-07-08 DIAGNOSIS — Z6841 Body Mass Index (BMI) 40.0 and over, adult: Secondary | ICD-10-CM

## 2021-07-08 DIAGNOSIS — E669 Obesity, unspecified: Secondary | ICD-10-CM | POA: Diagnosis not present

## 2021-07-08 DIAGNOSIS — E559 Vitamin D deficiency, unspecified: Secondary | ICD-10-CM | POA: Diagnosis not present

## 2021-07-08 DIAGNOSIS — R7303 Prediabetes: Secondary | ICD-10-CM | POA: Diagnosis not present

## 2021-07-08 MED ORDER — VITAMIN D (ERGOCALCIFEROL) 1.25 MG (50000 UNIT) PO CAPS: ORAL_CAPSULE | ORAL | 0 refills | Status: DC

## 2021-07-09 NOTE — Progress Notes (Signed)
Chief Complaint:   OBESITY Randy Higgins is here to discuss his progress with his obesity treatment plan along with follow-up of his obesity related diagnoses. Randy Higgins is on the Category 3 Plan and states he is following his eating plan approximately 60% of the time. Randy Higgins states he is doing 0 minutes 0 times per week.  Today's visit was #: 4 Starting weight: 252 lbs Starting date: 05/14/2021 Today's weight: 251 lbs Today's date: 07/08/2021 Total lbs lost to date: 1 lb Total lbs lost since last in-office visit: 0  Interim History: [name] is up 1 lb since his last visit. He is doing well with his protein and water. He is doing his meal planning.   Subjective:   1. Vitamin D deficiency [name] is taking his medications as directed.   2. Pre-diabetes [name] has taken Metformin in the past and noted gastrointestinal possibilities. He was prescribed Wegovy but, it was not covered.   Assessment/Plan:   1. Vitamin D deficiency Low Vitamin D level contributes to fatigue and are associated with obesity, breast, and colon cancer. We will refill prescription Vitamin D 50,000 IU every week for 1 month with no refills and Ferry will follow-up for routine testing of Vitamin D, at least 2-3 times per year to avoid over-replacement. We will recheck in the future.   - Vitamin D, Ergocalciferol, (DRISDOL) 1.25 MG (50000 UNIT) CAPS capsule; 1 cap twice week  Dispense: 8 capsule; Refill: 0  2. Pre-diabetes Randy Higgins will minimize starches and sweets. He will continue with the plan and exercise. He will continue to work on weight loss, exercise, and decreasing simple carbohydrates to help decrease the risk of diabetes.   3. Obesity, Current BMI 40.5 Randy Higgins is currently in the action stage of change. As such, his goal is to continue with weight loss efforts. He has agreed to the Category 3 Plan and keeping a food journal and adhering to recommended goals of 1500 calories and 90 grams of  protein.   Randy Higgins will continue meal planning and he will adhere to the plan 80-90%.   Exercise goals:  Randy Higgins will continue walking.   Behavioral modification strategies: increasing lean protein intake, decreasing simple carbohydrates, increasing vegetables, increasing water intake, decreasing eating out, no skipping meals, meal planning and cooking strategies, keeping healthy foods in the home, and planning for success.  [name] has agreed to follow-up with our clinic in 3 weeks. He was informed of the importance of frequent follow-up visits to maximize his success with intensive lifestyle modifications for his multiple health conditions.   Objective:   Blood pressure 120/72, pulse 82, temperature (!) 97.4 F (36.3 C), height 5\' 6"  (1.676 m), weight 251 lb (113.9 kg), SpO2 96 %. Body mass index is 40.51 kg/m.  General: Cooperative, alert, well developed, in no acute distress. HEENT: Conjunctivae and lids unremarkable. Cardiovascular: Regular rhythm.  Lungs: Normal work of breathing. Neurologic: No focal deficits.   Lab Results  Component Value Date   CREATININE 0.97 05/14/2021   BUN 12 05/14/2021   NA 139 05/14/2021   K 4.4 05/14/2021   CL 101 05/14/2021   CO2 23 05/14/2021   Lab Results  Component Value Date   ALT 24 05/14/2021   AST 20 05/14/2021   ALKPHOS 115 05/14/2021   BILITOT 0.4 05/14/2021   Lab Results  Component Value Date   HGBA1C 4.8 05/14/2021   Lab Results  Component Value Date   INSULIN 13.1 05/14/2021   Lab Results  Component Value  Date   TSH 1.090 05/14/2021   Lab Results  Component Value Date   CHOL 216 (H) 05/14/2021   HDL 33 (L) 05/14/2021   LDLCALC 172 (H) 05/14/2021   TRIG 62 05/14/2021   Lab Results  Component Value Date   VD25OH 9.4 (L) 05/14/2021   No results found for: WBC, HGB, HCT, MCV, PLT No results found for: IRON, TIBC, FERRITIN  Attestation Statements:   Reviewed by clinician on day of visit: allergies,  medications, problem list, medical history, surgical history, family history, social history, and previous encounter notes.  I, Jackson Latino, RMA, am acting as Energy manager for [employer], DO.  I have reviewed the above documentation for accuracy and completeness, and I agree with the above. Corinna Capra, DO

## 2021-07-15 ENCOUNTER — Encounter (INDEPENDENT_AMBULATORY_CARE_PROVIDER_SITE_OTHER): Payer: Self-pay | Admitting: Bariatrics

## 2021-07-23 ENCOUNTER — Encounter (INDEPENDENT_AMBULATORY_CARE_PROVIDER_SITE_OTHER): Payer: Self-pay | Admitting: Adult Health

## 2021-07-23 ENCOUNTER — Other Ambulatory Visit (INDEPENDENT_AMBULATORY_CARE_PROVIDER_SITE_OTHER): Payer: Self-pay | Admitting: Adult Health

## 2021-07-23 ENCOUNTER — Ambulatory Visit (INDEPENDENT_AMBULATORY_CARE_PROVIDER_SITE_OTHER): Payer: Medicaid Other | Admitting: Adult Health

## 2021-07-23 VITALS — BP 110/66 | HR 89 | Temp 98.1°F | Ht 66.0 in | Wt 252.0 lb

## 2021-07-23 DIAGNOSIS — E8881 Metabolic syndrome: Secondary | ICD-10-CM

## 2021-07-23 DIAGNOSIS — Z6841 Body Mass Index (BMI) 40.0 and over, adult: Secondary | ICD-10-CM | POA: Diagnosis not present

## 2021-07-23 DIAGNOSIS — E559 Vitamin D deficiency, unspecified: Secondary | ICD-10-CM | POA: Diagnosis not present

## 2021-07-23 DIAGNOSIS — E669 Obesity, unspecified: Secondary | ICD-10-CM | POA: Diagnosis not present

## 2021-07-23 MED ORDER — BD PEN NEEDLE NANO 2ND GEN 32G X 4 MM MISC: 0 refills | Status: DC

## 2021-07-23 MED ORDER — SAXENDA 18 MG/3ML ~~LOC~~ SOPN: PEN_INJECTOR | SUBCUTANEOUS | 0 refills | Status: DC

## 2021-07-23 MED ORDER — VITAMIN D (ERGOCALCIFEROL) 1.25 MG (50000 UNIT) PO CAPS: ORAL_CAPSULE | ORAL | 0 refills | Status: DC

## 2021-07-24 NOTE — Progress Notes (Signed)
Chief Complaint:   OBESITY Randy Higgins is here to discuss his progress with his obesity treatment plan along with follow-up of his obesity related diagnoses. Randy Higgins is on the Category 3 Plan and states he is following his eating plan approximately 0% of the time. Randy Higgins states he is not currently exercising.  Today's visit was #: 5 Starting weight: 252 lbs Starting date: 05/14/2021 Today's weight: 252 lbs Today's date: 07/23/21 Total lbs lost to date: 0 Total lbs lost since last in-office visit: +1  Interim History:  He is unable to tolerate metformin-GI upset. His insurance will not cover [facility] therapy.  He recently went to [place] for 7 days-all inclusive.   He is happy to have essentially maintained his weight.  Subjective:   1. Vitamin D deficiency 05/14/2021 vitamin D level-9.4-well below goal of 50-70. He is on twice weekly ergocalciferol. He denies N/V/Muscle Weakness.  2. Insulin resistance He is unable to tolerate metformin-GI upset. His insurance will not cover [facility] therapy. He has family history of T2D-mother, father. He denies family hx of MTC or personal hx of MENS 2 or pancreatitis. Discussed risks/benefits of GLP-1 therapy, ie; Saxenda.  Assessment/Plan:   1. Vitamin D deficiency Check labs at next office visit. Refill- Vitamin D, Ergocalciferol, (DRISDOL) 1.25 MG (50000 UNIT) CAPS capsule; 1 cap twice week  Dispense: 8 capsule; Refill: 0  2. Insulin resistance Check labs at next office visit.  Start- Liraglutide -Weight Management (SAXENDA) 18 MG/3ML SOPN; Inject 0.6mg  daily for two weeks, then increase to 1.2mg  daily- hold at this dose.  Dispense: 2 mL; Refill: 0 - Insulin Pen Needle (BD PEN NEEDLE NANO 2ND GEN) 32G X 4 MM MISC; BS BID  Dispense: 100 each; Refill: 0  3. Obesity, current BMI 40.7  [name] is currently in the action stage of change. As such, his goal is to continue with weight loss efforts. He has agreed to the Category 3  Plan.   Check fasting labs at next office visit.  Exercise goals:  Treadmill - 30 to 35 minutes Stationary bike -10 minutes  Behavioral modification strategies: increasing lean protein intake, decreasing simple carbohydrates, increasing water intake, meal planning and cooking strategies, keeping healthy foods in the home, better snacking choices, and planning for success.  [name] has agreed to follow-up with our clinic in 3 weeks. He was informed of the importance of frequent follow-up visits to maximize his success with intensive lifestyle modifications for his multiple health conditions.    Objective:   Blood pressure 110/66, pulse 89, temperature 98.1 F (36.7 C), height 5\' 6"  (1.676 m), weight 252 lb (114.3 kg), SpO2 95 %. Body mass index is 40.67 kg/m.  General: Cooperative, alert, well developed, in no acute distress. HEENT: Conjunctivae and lids unremarkable. Cardiovascular: Regular rhythm.  Lungs: Normal work of breathing. Neurologic: No focal deficits.   Lab Results  Component Value Date   CREATININE 0.97 05/14/2021   BUN 12 05/14/2021   NA 139 05/14/2021   K 4.4 05/14/2021   CL 101 05/14/2021   CO2 23 05/14/2021   Lab Results  Component Value Date   ALT 24 05/14/2021   AST 20 05/14/2021   ALKPHOS 115 05/14/2021   BILITOT 0.4 05/14/2021   Lab Results  Component Value Date   HGBA1C 4.8 05/14/2021   Lab Results  Component Value Date   INSULIN 13.1 05/14/2021   Lab Results  Component Value Date   TSH 1.090 05/14/2021   Lab Results  Component Value  Date   CHOL 216 (H) 05/14/2021   HDL 33 (L) 05/14/2021   LDLCALC 172 (H) 05/14/2021   TRIG 62 05/14/2021   Lab Results  Component Value Date   VD25OH 9.4 (L) 05/14/2021   No results found for: "WBC", "HGB", "HCT", "MCV", "PLT" No results found for: "IRON", "TIBC", "FERRITIN"   Attestation Statements:   Reviewed by clinician on day of visit: allergies, medications, problem list, medical history,  surgical history, family history, social history, and previous encounter notes.  I, Jesse Sans, FNP, am acting as Energy manager for William Hamburger, NP.  I have reviewed the above documentation for accuracy and completeness, and I agree with the above. -  Linda Biehn d. Brizeyda Holtmeyer, NP-C

## 2021-07-25 NOTE — Telephone Encounter (Addendum)
I will start prior authorization for Saxenda. Patient has a Recruitment consultant and they do not cover obesity medication.

## 2021-07-29 ENCOUNTER — Encounter (INDEPENDENT_AMBULATORY_CARE_PROVIDER_SITE_OTHER): Payer: Self-pay

## 2021-07-29 ENCOUNTER — Telehealth (INDEPENDENT_AMBULATORY_CARE_PROVIDER_SITE_OTHER): Payer: Self-pay | Admitting: Adult Health

## 2021-08-13 ENCOUNTER — Ambulatory Visit (INDEPENDENT_AMBULATORY_CARE_PROVIDER_SITE_OTHER): Payer: Medicaid Other | Admitting: Adult Health

## 2021-08-13 ENCOUNTER — Encounter (INDEPENDENT_AMBULATORY_CARE_PROVIDER_SITE_OTHER): Payer: Self-pay | Admitting: Adult Health

## 2021-08-13 VITALS — BP 118/70 | HR 78 | Temp 98.1°F | Ht 66.0 in | Wt 254.0 lb

## 2021-08-13 DIAGNOSIS — E669 Obesity, unspecified: Secondary | ICD-10-CM | POA: Diagnosis not present

## 2021-08-13 DIAGNOSIS — E559 Vitamin D deficiency, unspecified: Secondary | ICD-10-CM | POA: Diagnosis not present

## 2021-08-13 DIAGNOSIS — Z6841 Body Mass Index (BMI) 40.0 and over, adult: Secondary | ICD-10-CM | POA: Diagnosis not present

## 2021-08-13 MED ORDER — VITAMIN D (ERGOCALCIFEROL) 1.25 MG (50000 UNIT) PO CAPS: ORAL_CAPSULE | ORAL | 0 refills | Status: DC

## 2021-08-13 NOTE — Progress Notes (Signed)
Chief Complaint:   OBESITY Randy Higgins is here to discuss his progress with his obesity treatment plan along with follow-up of his obesity related diagnoses. [name] is on the Category 3 Plan and states he is following his eating plan approximately 0% of the time. [name] states he is doing cardio for 30 minutes 2 times per week.  Today's visit was #: 6 Starting weight: 252 lbs Starting date: 05/14/2021 Today's weight: 254 lbs Today's date: 08/13/2021 Total lbs lost to date: 0 Total lbs lost since last in-office visit: 0  Interim History: [name]'s insurance plan will not cover any GLP-1 medications for obesity.  He will run 30 minutes 2 times per week before work.  Our bioimpedance scale shows +0.8 lbs muscle mass, and +1.4 lbs adipose mass.  Subjective:   1. Vitamin D deficiency On 05/14/2021, Randy Higgins's vitamin D level was 9.4, white low.  He is on twice weekly Ergocalciferol, and he is tolerating it well.   Assessment/Plan:   1. Vitamin D deficiency [name] will continue Ergo 50,000 units 1 capsule twice weekly, and we will refill for 1 month.  - Vitamin D, Ergocalciferol, (DRISDOL) 1.25 MG (50000 UNIT) CAPS capsule; 1 cap twice week  Dispense: 8 capsule; Refill: 0  2. Obesity, current BMI 41.1 [name] is currently in the action stage of change. As such, his goal is to continue with weight loss efforts. He has agreed to the Category 3 Plan.   We will recheck fasting labs at his next office visit.  Grocery shop.  Pack foods when working.  Follow the category plan and tannates when traveling.  Exercise goals: As is.   Behavioral modification strategies: increasing lean protein intake, decreasing simple carbohydrates, increasing water intake, meal planning and cooking strategies, keeping healthy foods in the home, travel eating strategies, and planning for success.  [name] has agreed to follow-up with our clinic in 3 to 4 weeks. He was informed of the importance of  frequent follow-up visits to maximize his success with intensive lifestyle modifications for his multiple health conditions.   Objective:   Blood pressure 118/70, pulse 78, temperature 98.1 F (36.7 C), height 5\' 6"  (1.676 m), weight 254 lb (115.2 kg), SpO2 98 %. Body mass index is 41 kg/m.  General: Cooperative, alert, well developed, in no acute distress. HEENT: Conjunctivae and lids unremarkable. Cardiovascular: Regular rhythm.  Lungs: Normal work of breathing. Neurologic: No focal deficits.   Lab Results  Component Value Date   CREATININE 0.97 05/14/2021   BUN 12 05/14/2021   NA 139 05/14/2021   K 4.4 05/14/2021   CL 101 05/14/2021   CO2 23 05/14/2021   Lab Results  Component Value Date   ALT 24 05/14/2021   AST 20 05/14/2021   ALKPHOS 115 05/14/2021   BILITOT 0.4 05/14/2021   Lab Results  Component Value Date   HGBA1C 4.8 05/14/2021   Lab Results  Component Value Date   INSULIN 13.1 05/14/2021   Lab Results  Component Value Date   TSH 1.090 05/14/2021   Lab Results  Component Value Date   CHOL 216 (H) 05/14/2021   HDL 33 (L) 05/14/2021   LDLCALC 172 (H) 05/14/2021   TRIG 62 05/14/2021   Lab Results  Component Value Date   VD25OH 9.4 (L) 05/14/2021   No results found for: "WBC", "HGB", "HCT", "MCV", "PLT" No results found for: "IRON", "TIBC", "FERRITIN"  Attestation Statements:   Reviewed by clinician on day of visit: allergies, medications, problem list, medical  history, surgical history, family history, social history, and previous encounter notes.   Trude Mcburney, am acting as transcriptionist for William Hamburger, NP.  I have reviewed the above documentation for accuracy and completeness, and I agree with the above. -  ***

## 2021-09-03 ENCOUNTER — Encounter (INDEPENDENT_AMBULATORY_CARE_PROVIDER_SITE_OTHER): Payer: Self-pay | Admitting: Adult Health

## 2021-09-03 ENCOUNTER — Ambulatory Visit (INDEPENDENT_AMBULATORY_CARE_PROVIDER_SITE_OTHER): Payer: Medicaid Other | Admitting: Adult Health

## 2021-09-03 VITALS — BP 128/78 | HR 72 | Temp 97.6°F | Ht 66.0 in | Wt 250.0 lb

## 2021-09-03 DIAGNOSIS — E559 Vitamin D deficiency, unspecified: Secondary | ICD-10-CM | POA: Diagnosis not present

## 2021-09-03 DIAGNOSIS — E669 Obesity, unspecified: Secondary | ICD-10-CM | POA: Diagnosis not present

## 2021-09-03 DIAGNOSIS — E7849 Other hyperlipidemia: Secondary | ICD-10-CM | POA: Diagnosis not present

## 2021-09-03 DIAGNOSIS — E8881 Metabolic syndrome: Secondary | ICD-10-CM | POA: Diagnosis not present

## 2021-09-03 DIAGNOSIS — Z6841 Body Mass Index (BMI) 40.0 and over, adult: Secondary | ICD-10-CM

## 2021-09-04 ENCOUNTER — Encounter (INDEPENDENT_AMBULATORY_CARE_PROVIDER_SITE_OTHER): Payer: Self-pay | Admitting: Adult Health

## 2021-09-04 ENCOUNTER — Other Ambulatory Visit (INDEPENDENT_AMBULATORY_CARE_PROVIDER_SITE_OTHER): Payer: Self-pay | Admitting: Adult Health

## 2021-09-04 DIAGNOSIS — E559 Vitamin D deficiency, unspecified: Secondary | ICD-10-CM

## 2021-09-04 LAB — COMPREHENSIVE METABOLIC PANEL
ALT: 30 IU/L (ref 0–44)
AST: 25 IU/L (ref 0–40)
Albumin/Globulin Ratio: 1.4 (ref 1.2–2.2)
Albumin: 4.5 g/dL (ref 4.3–5.2)
Alkaline Phosphatase: 104 IU/L (ref 44–121)
BUN/Creatinine Ratio: 12 (ref 9–20)
BUN: 11 mg/dL (ref 6–20)
Bilirubin Total: 0.4 mg/dL (ref 0.0–1.2)
CO2: 22 mmol/L (ref 20–29)
Calcium: 9.5 mg/dL (ref 8.7–10.2)
Chloride: 102 mmol/L (ref 96–106)
Creatinine, Ser: 0.95 mg/dL (ref 0.76–1.27)
Globulin, Total: 3.3 g/dL (ref 1.5–4.5)
Glucose: 88 mg/dL (ref 70–99)
Potassium: 4.5 mmol/L (ref 3.5–5.2)
Sodium: 141 mmol/L (ref 134–144)
Total Protein: 7.8 g/dL (ref 6.0–8.5)
eGFR: 115 mL/min/{1.73_m2} (ref >59)

## 2021-09-04 LAB — LIPID PANEL
Chol/HDL Ratio: 5.8 ratio — ABNORMAL HIGH (ref 0.0–5.0)
Cholesterol, Total: 244 mg/dL — ABNORMAL HIGH (ref 100–199)
HDL: 42 mg/dL (ref >39)
LDL Chol Calc (NIH): 189 mg/dL — ABNORMAL HIGH (ref 0–99)
Triglycerides: 74 mg/dL (ref 0–149)
VLDL Cholesterol Cal: 13 mg/dL (ref 5–40)

## 2021-09-04 LAB — HEMOGLOBIN A1C
Est. average glucose Bld gHb Est-mCnc: 85 mg/dL
Hgb A1c MFr Bld: 4.6 % — ABNORMAL LOW (ref 4.8–5.6)

## 2021-09-04 LAB — INSULIN, RANDOM: INSULIN: 2.1 u[IU]/mL — ABNORMAL LOW (ref 2.6–24.9)

## 2021-09-04 LAB — VITAMIN D 25 HYDROXY (VIT D DEFICIENCY, FRACTURES): Vit D, 25-Hydroxy: 24.7 ng/mL — ABNORMAL LOW (ref 30.0–100.0)

## 2021-09-04 MED ORDER — VITAMIN D (ERGOCALCIFEROL) 1.25 MG (50000 UNIT) PO CAPS: ORAL_CAPSULE | ORAL | 0 refills | Status: DC

## 2021-09-05 NOTE — Progress Notes (Signed)
Chief Complaint:   OBESITY Randy Higgins is here to discuss his progress with his obesity treatment plan along with follow-up of his obesity related diagnoses. Eldin is on the Category 3 Plan and states he is following his eating plan approximately 0% of the time. Izic states he is not currently exercising.  Today's visit was #: 7 Starting weight: 252 lbs Starting date: 05/14/2021 Today's weight: 250 lbs Today's date: 09/03/21 Total lbs lost to date: 2 Total lbs lost since last in-office visit: -4  Interim History:  He traveled to [place] for 1 week to visit his brother.  He walked A LOT.  He is interested in fruits and vegetables only for 1 to 2 weeks in the form of juicing then converts back to Cat 3 meal pan. He also wants to reduce/eliminate unhealthy habits.  Subjective:   1. Vitamin D deficiency 05/14/2021 vitamin D level-9.4. He is on once weekly ergocalciferol.  2. Insulin resistance Insurance will not cover any GLP-1 therapy (injectable) for insulin resistance and obesity. No family history of diabetes.  3. Other hyperlipidemia His father's side has significant history of heart disease. He does not use tobacco/vape products. + Marijuana use.  Assessment/Plan:   1. Vitamin D deficiency Check labs then MyChart after labs, refill ergocalciferol as appropriate. - VITAMIN D 25 Hydroxy (Vit-D Deficiency, Fractures)  2. Insulin resistance Check labs - Comprehensive metabolic panel - Hemoglobin A1c - Insulin, random  3. Other hyperlipidemia Check labs - Lipid panel  4. Obesity, current BMI 40.4 Angelica is currently in the action stage of change. As such, his goal is to continue with weight loss efforts.  He has agreed to: 1.  Juice 2.  Category 3 3.  Journal- 1500 calories and 100 grams protein per day.  Exercise goals: All adults should avoid inactivity. Some physical activity is better than none, and adults who participate in any amount of  physical activity gain some health benefits.  Behavioral modification strategies: increasing lean protein intake, decreasing simple carbohydrates, meal planning and cooking strategies, keeping healthy foods in the home, and planning for success.  Brax has agreed to follow-up with our clinic in 4 weeks. He was informed of the importance of frequent follow-up visits to maximize his success with intensive lifestyle modifications for his multiple health conditions.   Mehtab was informed we would discuss his lab results at his next visit unless there is a critical issue that needs to be addressed sooner. Jentzen agreed to keep his next visit at the agreed upon time to discuss these results.  Objective:   Blood pressure 128/78, pulse 72, temperature 97.6 F (36.4 C), height 5\' 6"  (1.676 m), weight 250 lb (113.4 kg), SpO2 97 %. Body mass index is 40.35 kg/m.  General: Cooperative, alert, well developed, in no acute distress. HEENT: Conjunctivae and lids unremarkable. Cardiovascular: Regular rhythm.  Lungs: Normal work of breathing. Neurologic: No focal deficits.   Lab Results  Component Value Date   CREATININE 0.95 09/03/2021   BUN 11 09/03/2021   NA 141 09/03/2021   K 4.5 09/03/2021   CL 102 09/03/2021   CO2 22 09/03/2021   Lab Results  Component Value Date   ALT 30 09/03/2021   AST 25 09/03/2021   ALKPHOS 104 09/03/2021   BILITOT 0.4 09/03/2021   Lab Results  Component Value Date   HGBA1C 4.6 (L) 09/03/2021   HGBA1C 4.8 05/14/2021   Lab Results  Component Value Date   INSULIN 2.1 (L) 09/03/2021  INSULIN 13.1 05/14/2021   Lab Results  Component Value Date   TSH 1.090 05/14/2021   Lab Results  Component Value Date   CHOL 244 (H) 09/03/2021   HDL 42 09/03/2021   LDLCALC 189 (H) 09/03/2021   TRIG 74 09/03/2021   CHOLHDL 5.8 (H) 09/03/2021   Lab Results  Component Value Date   VD25OH 24.7 (L) 09/03/2021   VD25OH 9.4 (L) 05/14/2021   No results found for:  "WBC", "HGB", "HCT", "MCV", "PLT" No results found for: "IRON", "TIBC", "FERRITIN"   Attestation Statements:   Reviewed by clinician on day of visit: allergies, medications, problem list, medical history, surgical history, family history, social history, and previous encounter notes.  I, Jesse Sans, FNP, am acting as Energy manager for William Hamburger, NP.  I have reviewed the above documentation for accuracy and completeness, and I agree with the above. -  Korey Arroyo d. Cyla Haluska, NP-C

## 2021-09-11 ENCOUNTER — Encounter (INDEPENDENT_AMBULATORY_CARE_PROVIDER_SITE_OTHER): Payer: Self-pay

## 2021-09-18 ENCOUNTER — Other Ambulatory Visit (INDEPENDENT_AMBULATORY_CARE_PROVIDER_SITE_OTHER): Payer: Self-pay | Admitting: Adult Health

## 2021-09-18 DIAGNOSIS — E559 Vitamin D deficiency, unspecified: Secondary | ICD-10-CM

## 2021-10-01 ENCOUNTER — Ambulatory Visit (INDEPENDENT_AMBULATORY_CARE_PROVIDER_SITE_OTHER): Payer: Medicaid Other | Admitting: Adult Health

## 2021-10-01 ENCOUNTER — Encounter (INDEPENDENT_AMBULATORY_CARE_PROVIDER_SITE_OTHER): Payer: Self-pay | Admitting: Adult Health

## 2021-10-01 VITALS — BP 117/70 | HR 77 | Temp 97.9°F | Ht 66.0 in | Wt 249.0 lb

## 2021-10-01 DIAGNOSIS — E7849 Other hyperlipidemia: Secondary | ICD-10-CM | POA: Diagnosis not present

## 2021-10-01 DIAGNOSIS — Z6841 Body Mass Index (BMI) 40.0 and over, adult: Secondary | ICD-10-CM

## 2021-10-01 DIAGNOSIS — F419 Anxiety disorder, unspecified: Secondary | ICD-10-CM | POA: Diagnosis not present

## 2021-10-01 DIAGNOSIS — E8881 Metabolic syndrome: Secondary | ICD-10-CM | POA: Diagnosis not present

## 2021-10-01 DIAGNOSIS — E559 Vitamin D deficiency, unspecified: Secondary | ICD-10-CM

## 2021-10-01 DIAGNOSIS — E669 Obesity, unspecified: Secondary | ICD-10-CM

## 2021-10-01 MED ORDER — RYBELSUS 3 MG PO TABS: 3.0000 mg | ORAL_TABLET | Freq: Every day | ORAL | 0 refills | Status: DC

## 2021-10-01 MED ORDER — VITAMIN D (ERGOCALCIFEROL) 1.25 MG (50000 UNIT) PO CAPS
ORAL_CAPSULE | ORAL | 0 refills | Status: DC
Start: 2021-10-01 — End: 2021-10-29

## 2021-10-05 NOTE — Progress Notes (Unsigned)
Chief Complaint:   OBESITY Randy Higgins is here to discuss his progress with his obesity treatment plan along with follow-up of his obesity related diagnoses. Randy Higgins is on the Category 3 Plan and keeping a food journal and adhering to recommended goals of 1500 calories and 100 protein and states he is following his eating plan approximately 0% of the time. Randy Higgins states he is not exercising.   Today's visit was #: 8 Starting weight: 252 lbs Starting date: 05/14/2021 Today's weight: 249 lbs Today's date: 10/01/2021 Total lbs lost to date: 3 lbs Total lbs lost since last in-office visit: 1 lb  Interim History: He endorses recent insomnia with late afternoon Buspar 7.5 mg.  Subjective:   1. Vitamin D deficiency Discussed labs with patient today. 09/03/2021 Vitamin D level 24.7 on twice weekly Ergocalciferol.   2. Anxiety PCP started Buspirone 7.5 mg BID, PRN, often only uses one dose in the morning. If he takes it later in the day, insomnia happens.  Has been off daily Duloxetine 30 mg.   3. Other hyperlipidemia Worsening.  Discussed labs with patient today. He estimates to consume lamb 1 time per week and beef 2 times per week.  Lipid ***  4. Insulin resistance Discussed labs with patient today. 09/03/2021, blood glucose 88, A1c 4.6, Insulin level 2.1.  He denies family history of MTC or MENS.  He denies personal history of pancreatitis.  Assessment/Plan:   1. Vitamin D deficiency Refill - Vitamin D, Ergocalciferol, (DRISDOL) 1.25 MG (50000 UNIT) CAPS capsule; 1 cap twice week  Dispense: 8 capsule; Refill: 0  2. Anxiety Restart Duloxetine 30 mg daily, Buspar 7.5 mg, PRN in AM.  3. Other hyperlipidemia Decrease saturated fat, increase daily walking.  4. Insulin resistance Start - Semaglutide (RYBELSUS) 3 MG TABS; Take 3 mg by mouth daily.  Dispense: 30 tablet; Refill: 0  5. Obesity, current BMI 40.3 Randy Higgins is currently in the action stage of change. As such, his  goal is to continue with weight loss efforts. He has agreed to  the Category 3 Plan and keeping a food journal and adhering to recommended goals of 1500 calories and 100 protein  Exercise goals: All adults should avoid inactivity. Some physical activity is better than none, and adults who participate in any amount of physical activity gain some health benefits.  Behavioral modification strategies: increasing lean protein intake, decreasing simple carbohydrates, meal planning and cooking strategies, keeping healthy foods in the home, and planning for success.  Randy Higgins has agreed to follow-up with our clinic in 4 weeks. He was informed of the importance of frequent follow-up visits to maximize his success with intensive lifestyle modifications for his multiple health conditions.   Objective:   Blood pressure 117/70, pulse 77, temperature 97.9 F (36.6 C), height 5\' 6"  (1.676 m), weight 249 lb (112.9 kg), SpO2 96 %. Body mass index is 40.19 kg/m.  General: Cooperative, alert, well developed, in no acute distress. HEENT: Conjunctivae and lids unremarkable. Cardiovascular: Regular rhythm.  Lungs: Normal work of breathing. Neurologic: No focal deficits.   Lab Results  Component Value Date   CREATININE 0.95 09/03/2021   BUN 11 09/03/2021   NA 141 09/03/2021   K 4.5 09/03/2021   CL 102 09/03/2021   CO2 22 09/03/2021   Lab Results  Component Value Date   ALT 30 09/03/2021   AST 25 09/03/2021   ALKPHOS 104 09/03/2021   BILITOT 0.4 09/03/2021   Lab Results  Component Value Date  HGBA1C 4.6 (L) 09/03/2021   HGBA1C 4.8 05/14/2021   Lab Results  Component Value Date   INSULIN 2.1 (L) 09/03/2021   INSULIN 13.1 05/14/2021   Lab Results  Component Value Date   TSH 1.090 05/14/2021   Lab Results  Component Value Date   CHOL 244 (H) 09/03/2021   HDL 42 09/03/2021   LDLCALC 189 (H) 09/03/2021   TRIG 74 09/03/2021   CHOLHDL 5.8 (H) 09/03/2021   Lab Results  Component Value  Date   VD25OH 24.7 (L) 09/03/2021   VD25OH 9.4 (L) 05/14/2021   No results found for: "WBC", "HGB", "HCT", "MCV", "PLT" No results found for: "IRON", "TIBC", "FERRITIN"  Attestation Statements:   Reviewed by clinician on day of visit: allergies, medications, problem list, medical history, surgical history, family history, social history, and previous encounter notes.  I, Malcolm Metro, RMA, am acting as Energy manager for William Hamburger, NP.  I have reviewed the above documentation for accuracy and completeness, and I agree with the above. -  ***

## 2021-10-08 ENCOUNTER — Encounter (INDEPENDENT_AMBULATORY_CARE_PROVIDER_SITE_OTHER): Payer: Self-pay | Admitting: Adult Health

## 2021-10-08 DIAGNOSIS — F419 Anxiety disorder, unspecified: Secondary | ICD-10-CM | POA: Insufficient documentation

## 2021-10-08 MED ORDER — DULOXETINE HCL 30 MG PO CPEP: 30.0000 mg | ORAL_CAPSULE | Freq: Every day | ORAL | 0 refills | Status: DC

## 2021-10-09 ENCOUNTER — Telehealth (INDEPENDENT_AMBULATORY_CARE_PROVIDER_SITE_OTHER): Payer: Self-pay | Admitting: Adult Health

## 2021-10-09 NOTE — Telephone Encounter (Addendum)
Randy Higgins - Prior authorization denied for Rybelsus. Per insurance: Patient has [facility] Medicaid. Per your health plan's criteria, this drug is covered if you meet the following: If the request is for a non-preferred drug, you have tried or cannot use two preferred drugs: Byetta, Ozempic, Trulicity and Victoza. Patient sent denial message via mychart.

## 2021-10-28 ENCOUNTER — Ambulatory Visit (INDEPENDENT_AMBULATORY_CARE_PROVIDER_SITE_OTHER): Payer: Medicaid Other | Admitting: Adult Health

## 2021-10-28 ENCOUNTER — Encounter (INDEPENDENT_AMBULATORY_CARE_PROVIDER_SITE_OTHER): Payer: Self-pay

## 2021-10-29 ENCOUNTER — Encounter (INDEPENDENT_AMBULATORY_CARE_PROVIDER_SITE_OTHER): Payer: Self-pay | Admitting: Adult Health

## 2021-10-29 ENCOUNTER — Ambulatory Visit (INDEPENDENT_AMBULATORY_CARE_PROVIDER_SITE_OTHER): Payer: Medicaid Other | Admitting: Adult Health

## 2021-10-29 VITALS — BP 116/79 | HR 75 | Temp 97.9°F | Ht 66.0 in | Wt 254.0 lb

## 2021-10-29 DIAGNOSIS — E669 Obesity, unspecified: Secondary | ICD-10-CM | POA: Diagnosis not present

## 2021-10-29 DIAGNOSIS — F419 Anxiety disorder, unspecified: Secondary | ICD-10-CM

## 2021-10-29 DIAGNOSIS — E559 Vitamin D deficiency, unspecified: Secondary | ICD-10-CM

## 2021-10-29 DIAGNOSIS — Z6841 Body Mass Index (BMI) 40.0 and over, adult: Secondary | ICD-10-CM | POA: Diagnosis not present

## 2021-10-29 MED ORDER — VITAMIN D (ERGOCALCIFEROL) 1.25 MG (50000 UNIT) PO CAPS: ORAL_CAPSULE | ORAL | 0 refills | Status: DC

## 2021-10-31 MED ORDER — DULOXETINE HCL 30 MG PO CPEP: 30.0000 mg | ORAL_CAPSULE | Freq: Every day | ORAL | 0 refills | Status: DC

## 2021-10-31 NOTE — Progress Notes (Addendum)
Chief Complaint:   OBESITY Randy Higgins is here to discuss his progress with his obesity treatment plan along with follow-up of his obesity related diagnoses. Randy Higgins is on the Category 3 Plan and states he is following his eating plan approximately 0% of the time. Randy Higgins states he is not exercising.   Today's visit was #: 9 Starting weight: 252 lbs Starting date: 05/14/2021 Today's weight: 254 lbs Today's date: 10/29/2021 Total lbs lost to date: 0 Total lbs lost since last in-office visit: +5 lbs  Interim History:  He had a recent Covid-19 infections.  Acute symptoms, cough, nasal congestion, fever, chills, fatigue, poor appetite.   Completed full course of Paxlovid.   He denies sx's at present.  He is moving from [place] to [place], into 3 bedroom house with a roommate.   Subjective:   1. Vitamin D deficiency Vitamin D level 09/03/2021, 24.7.   He is taking twice weekly ergocalciferol.   2. Anxiety Buspirone 7.5 mg daily, managed by his PCP.   He was restarted on his Duloxetine 30 mg daily on 10/01/2021.  He reports stable mood and denies any suicidal or homicidal ideations.   Assessment/Plan:   1. Vitamin D deficiency Refill - Vitamin D, Ergocalciferol, (DRISDOL) 1.25 MG (50000 UNIT) CAPS capsule; 1 cap twice week  Dispense: 8 capsule; Refill: 0  2. Anxiety Refill Duloxetine 30 mg daily, dispense 30 capsules, no refills.   3. Obesity, current BMI 41.0 Randy Higgins is currently in the action stage of change. As such, his goal is to continue with weight loss efforts. He has agreed to the Category 3 Plan.   Exercise goals:  Increase daily walking.   Behavioral modification strategies: increasing lean protein intake, decreasing simple carbohydrates, meal planning and cooking strategies, keeping healthy foods in the home, and planning for success.  Randy Higgins has agreed to follow-up with our clinic in 3 weeks. He was informed of the importance of frequent follow-up  visits to maximize his success with intensive lifestyle modifications for his multiple health conditions.   Objective:   Blood pressure 116/79, pulse 75, temperature 97.9 F (36.6 C), height 5\' 6"  (1.676 m), weight 254 lb (115.2 kg), SpO2 95 %. Body mass index is 41 kg/m.  General: Cooperative, alert, well developed, in no acute distress. HEENT: Conjunctivae and lids unremarkable. Cardiovascular: Regular rhythm.  Lungs: Normal work of breathing. Neurologic: No focal deficits.   Lab Results  Component Value Date   CREATININE 0.95 09/03/2021   BUN 11 09/03/2021   NA 141 09/03/2021   K 4.5 09/03/2021   CL 102 09/03/2021   CO2 22 09/03/2021   Lab Results  Component Value Date   ALT 30 09/03/2021   AST 25 09/03/2021   ALKPHOS 104 09/03/2021   BILITOT 0.4 09/03/2021   Lab Results  Component Value Date   HGBA1C 4.6 (L) 09/03/2021   HGBA1C 4.8 05/14/2021   Lab Results  Component Value Date   INSULIN 2.1 (L) 09/03/2021   INSULIN 13.1 05/14/2021   Lab Results  Component Value Date   TSH 1.090 05/14/2021   Lab Results  Component Value Date   CHOL 244 (H) 09/03/2021   HDL 42 09/03/2021   LDLCALC 189 (H) 09/03/2021   TRIG 74 09/03/2021   CHOLHDL 5.8 (H) 09/03/2021   Lab Results  Component Value Date   VD25OH 24.7 (L) 09/03/2021   VD25OH 9.4 (L) 05/14/2021   No results found for: "WBC", "HGB", "HCT", "MCV", "PLT" No results found for: "  IRON", "TIBC", "FERRITIN"  Attestation Statements:   Reviewed by clinician on day of visit: allergies, medications, problem list, medical history, surgical history, family history, social history, and previous encounter notes.  I, Davy Pique, RMA, am acting as Location manager for Mina Marble, NP.  I have reviewed the above documentation for accuracy and completeness, and I agree with the above. -  Misao Fackrell d. Tatum Corl, NP-C

## 2021-11-25 ENCOUNTER — Ambulatory Visit (INDEPENDENT_AMBULATORY_CARE_PROVIDER_SITE_OTHER): Payer: Medicaid Other | Admitting: Adult Health

## 2021-11-25 ENCOUNTER — Encounter (INDEPENDENT_AMBULATORY_CARE_PROVIDER_SITE_OTHER): Payer: Self-pay | Admitting: Adult Health

## 2021-11-25 VITALS — BP 110/80 | HR 73 | Temp 97.8°F | Ht 66.0 in | Wt 257.0 lb

## 2021-11-25 DIAGNOSIS — F419 Anxiety disorder, unspecified: Secondary | ICD-10-CM

## 2021-11-25 DIAGNOSIS — E559 Vitamin D deficiency, unspecified: Secondary | ICD-10-CM

## 2021-11-25 DIAGNOSIS — E669 Obesity, unspecified: Secondary | ICD-10-CM

## 2021-11-25 DIAGNOSIS — Z6841 Body Mass Index (BMI) 40.0 and over, adult: Secondary | ICD-10-CM

## 2021-11-25 DIAGNOSIS — F411 Generalized anxiety disorder: Secondary | ICD-10-CM
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
## 2021-11-25 MED ORDER — DULOXETINE HCL 30 MG PO CPEP: 30.0000 mg | ORAL_CAPSULE | Freq: Every day | ORAL | 0 refills | Status: DC

## 2021-11-25 MED ORDER — VITAMIN D (ERGOCALCIFEROL) 1.25 MG (50000 UNIT) PO CAPS: ORAL_CAPSULE | ORAL | 0 refills | Status: DC

## 2021-11-29 NOTE — Progress Notes (Unsigned)
Chief Complaint:   OBESITY Randy Higgins is here to discuss his progress with his obesity treatment plan along with follow-up of his obesity related diagnoses. Randy Higgins is on the Category 3 Plan and states he is following his eating plan approximately 0% of the time. Randy Higgins states he is walking 120 minutes 4 times per week.  Today's visit was #: 10 Starting weight: 252 lbs Starting date: 05/14/2021 Today's weight: 257 lbs Today's date: 11/25/2021 Total lbs lost to date: 0 Total lbs lost since last in-office visit: 0  Interim History: He is wearing abdominal binder during office visit.  He reports restarting use of waist trainer today.  He wants to incorporate weights with trail walking, 2 hours, 4-5 days per week.  11/13/21, Virtual appointment with PCP for GAD, he is taking Cymbalta, increased from 30 mg to 60 mg.  Buspar has been discontinued.  He also started on hydroxyzine 25 mg TID for anxiety.   Subjective:   1. Vitamin D deficiency 09/03/2021, Vitamin D level 24.7.  09/04/2021, ergocalciferol increased from weekly to twice weekly.    2. GAD (generalized anxiety disorder) *** Cymbalta 60 mg, causes nausea and a poor appetite.  Last 2 weeks consistently on on Cymbalta 30 mg, then increased to 60 mg daily on 12/09/2021  Assessment/Plan:   1. Vitamin D deficiency Refill - Vitamin D, Ergocalciferol, (DRISDOL) 1.25 MG (50000 UNIT) CAPS capsule; 1 cap twice week  Dispense: 8 capsule; Refill: 0  2. GAD (generalized anxiety disorder) One time refill:  Refill - DULoxetine (CYMBALTA) 30 MG capsule; Take 1 capsule (30 mg total) by mouth daily.  Dispense: 30 capsule; Refill: 0  3. Obesity, current BMI 41.5 Randy Higgins is currently in the action stage of change. As such, his goal is to continue with weight loss efforts. He has agreed to practicing portion control and making smarter food choices, such as increasing vegetables and decreasing simple carbohydrates.   Exercise goals:  As  is.   Behavioral modification strategies: increasing lean protein intake, decreasing simple carbohydrates, meal planning and cooking strategies, keeping healthy foods in the home, and planning for success.  Randy Higgins has agreed to follow-up with our clinic in 4 weeks. He was informed of the importance of frequent follow-up visits to maximize his success with intensive lifestyle modifications for his multiple health conditions.   Objective:   Blood pressure 110/80, pulse 73, temperature 97.8 F (36.6 C), height 5\' 6"  (1.676 m), weight 257 lb (116.6 kg), SpO2 97 %. Body mass index is 41.48 kg/m.  General: Cooperative, alert, well developed, in no acute distress. HEENT: Conjunctivae and lids unremarkable. Cardiovascular: Regular rhythm.  Lungs: Normal work of breathing. Neurologic: No focal deficits.   Lab Results  Component Value Date   CREATININE 0.95 09/03/2021   BUN 11 09/03/2021   NA 141 09/03/2021   K 4.5 09/03/2021   CL 102 09/03/2021   CO2 22 09/03/2021   Lab Results  Component Value Date   ALT 30 09/03/2021   AST 25 09/03/2021   ALKPHOS 104 09/03/2021   BILITOT 0.4 09/03/2021   Lab Results  Component Value Date   HGBA1C 4.6 (L) 09/03/2021   HGBA1C 4.8 05/14/2021   Lab Results  Component Value Date   INSULIN 2.1 (L) 09/03/2021   INSULIN 13.1 05/14/2021   Lab Results  Component Value Date   TSH 1.090 05/14/2021   Lab Results  Component Value Date   CHOL 244 (H) 09/03/2021   HDL 42 09/03/2021  LDLCALC 189 (H) 09/03/2021   TRIG 74 09/03/2021   CHOLHDL 5.8 (H) 09/03/2021   Lab Results  Component Value Date   VD25OH 24.7 (L) 09/03/2021   VD25OH 9.4 (L) 05/14/2021   No results found for: "WBC", "HGB", "HCT", "MCV", "PLT" No results found for: "IRON", "TIBC", "FERRITIN"  Attestation Statements:   Reviewed by clinician on day of visit: allergies, medications, problem list, medical history, surgical history, family history, social history, and previous  encounter notes.  I, Davy Pique, RMA, am acting as Location manager for Mina Marble, NP.  I have reviewed the above documentation for accuracy and completeness, and I agree with the above. -  ***

## 2021-12-17 ENCOUNTER — Encounter (INDEPENDENT_AMBULATORY_CARE_PROVIDER_SITE_OTHER): Payer: Self-pay | Admitting: Family Medicine

## 2021-12-17 ENCOUNTER — Ambulatory Visit (INDEPENDENT_AMBULATORY_CARE_PROVIDER_SITE_OTHER): Payer: Medicaid Other | Admitting: Family Medicine

## 2021-12-17 VITALS — BP 131/75 | HR 74 | Temp 97.9°F | Ht 66.0 in | Wt 256.0 lb

## 2021-12-17 DIAGNOSIS — E7849 Other hyperlipidemia: Secondary | ICD-10-CM

## 2021-12-17 DIAGNOSIS — E669 Obesity, unspecified: Secondary | ICD-10-CM

## 2021-12-17 DIAGNOSIS — E559 Vitamin D deficiency, unspecified: Secondary | ICD-10-CM

## 2021-12-17 DIAGNOSIS — F419 Anxiety disorder, unspecified: Secondary | ICD-10-CM

## 2021-12-17 DIAGNOSIS — E66813 Obesity, class 3: Secondary | ICD-10-CM

## 2021-12-17 DIAGNOSIS — Z6841 Body Mass Index (BMI) 40.0 and over, adult: Secondary | ICD-10-CM

## 2021-12-17 MED ORDER — VITAMIN D (ERGOCALCIFEROL) 1.25 MG (50000 UNIT) PO CAPS: ORAL_CAPSULE | ORAL | 0 refills | Status: DC

## 2021-12-31 NOTE — Progress Notes (Signed)
Chief Complaint:   OBESITY Randy Higgins is here to discuss his progress with his obesity treatment plan along with follow-up of his obesity related diagnoses. Randy Higgins is on practicing portion control and making smarter food choices, such as increasing vegetables and decreasing simple carbohydrates and states he is following his eating plan approximately 0% of the time. Randy Higgins states he is doing 0 minutes 0 times per week.  Today's visit was #: 11 Starting weight: 252 lbs Starting date: 05/14/2021 Today's weight: 256 lbs Today's date: 12/17/2021 Total lbs lost to date: 0 Total lbs lost since last in-office visit: 1  Interim History: Randy Higgins is working on trying to follow his eating plan, not skip meals, and get his protein needs met.  Subjective:   1. Vitamin D deficiency Randy Higgins's last vitamin D level was 24.7.  He is taking vitamin D prescription with no side effects noted.  2. Other hyperlipidemia Randy Higgins's last LDL was 189, HDL 42, and triglycerides 74.  He is working on his healthy eating plan and exercise.  3. Anxiety Randy Higgins is taking Cymbalta and hydroxyzine as needed.  No side effects were noted.  Assessment/Plan:   1. Vitamin D deficiency Randy Higgins will continue prescription Vitamin D 50,000 IU every week, and we will refill for 1 month. He will follow-up for routine testing of Vitamin D, at least 2-3 times per year to avoid over-replacement.  - Vitamin D, Ergocalciferol, (DRISDOL) 1.25 MG (50000 UNIT) CAPS capsule; 1 cap twice week  Dispense: 8 capsule; Refill: 0  2. Other hyperlipidemia Randy Higgins will continue with his healthy eating plan and exercise.  3. Anxiety Randy Higgins will continue his medications, healthy eating plan, and exercise.  4. Obesity, Current BMI 41.4 Randy Higgins is currently in the action stage of change. As such, his goal is to continue with weight loss efforts. He has agreed to practicing portion control and making smarter food choices, such  as increasing vegetables and decreasing simple carbohydrates.   Exercise goals: All adults should avoid inactivity. Some physical activity is better than none, and adults who participate in any amount of physical activity gain some health benefits.  Behavioral modification strategies: increasing lean protein intake, meal planning and cooking strategies, keeping healthy foods in the home, and holiday eating strategies .  Randy Higgins has agreed to follow-up with our clinic in 3 to 4 weeks. He was informed of the importance of frequent follow-up visits to maximize his success with intensive lifestyle modifications for his multiple health conditions.   Objective:   Blood pressure 131/75, pulse 74, temperature 97.9 F (36.6 C), height _0  (1.676 m), weight 256 lb (116.1 kg), SpO2 98 %. Body mass index is 41.32 kg/m.  General: Cooperative, alert, well developed, in no acute distress. HEENT: Conjunctivae and lids unremarkable. Cardiovascular: Regular rhythm.  Lungs: Normal work of breathing. Neurologic: No focal deficits.   Lab Results  Component Value Date   CREATININE 0.95 09/03/2021   BUN 11 09/03/2021   NA 141 09/03/2021   K 4.5 09/03/2021   CL 102 09/03/2021   CO2 22 09/03/2021   Lab Results  Component Value Date   ALT 30 09/03/2021   AST 25 09/03/2021   ALKPHOS 104 09/03/2021   BILITOT 0.4 09/03/2021   Lab Results  Component Value Date   HGBA1C 4.6 (L) 09/03/2021   HGBA1C 4.8 05/14/2021   Lab Results  Component Value Date   INSULIN 2.1 (L) 09/03/2021   INSULIN 13.1 05/14/2021   Lab Results  Component Value Date  TSH 1.090 05/14/2021   Lab Results  Component Value Date   CHOL 244 (H) 09/03/2021   HDL 42 09/03/2021   LDLCALC 189 (H) 09/03/2021   TRIG 74 09/03/2021   CHOLHDL 5.8 (H) 09/03/2021   Lab Results  Component Value Date   VD25OH 24.7 (L) 09/03/2021   VD25OH 9.4 (L) 05/14/2021   No results found for: "WBC", "HGB", "HCT", "MCV", "PLT" No results  found for: "IRON", "TIBC", "FERRITIN"  Attestation Statements:   Reviewed by clinician on day of visit: allergies, medications, problem list, medical history, surgical history, family history, social history, and previous encounter notes.  I have personally spent 43 minutes total time today in preparation, patient care, and documentation for this visit, including the following: review of clinical lab tests; review of medical tests/procedures/services.   I, Trixie Dredge, am acting as transcriptionist for Dennard Nip, MD.  I have reviewed the above documentation for accuracy and completeness, and I agree with the above. -  Dennard Nip, MD

## 2022-01-14 ENCOUNTER — Encounter (INDEPENDENT_AMBULATORY_CARE_PROVIDER_SITE_OTHER): Payer: Self-pay | Admitting: Family Medicine

## 2022-01-14 ENCOUNTER — Telehealth (INDEPENDENT_AMBULATORY_CARE_PROVIDER_SITE_OTHER): Payer: Medicaid Other | Admitting: Family Medicine

## 2022-01-14 DIAGNOSIS — E669 Obesity, unspecified: Secondary | ICD-10-CM | POA: Diagnosis not present

## 2022-01-14 DIAGNOSIS — E7849 Other hyperlipidemia: Secondary | ICD-10-CM

## 2022-01-14 DIAGNOSIS — E559 Vitamin D deficiency, unspecified: Secondary | ICD-10-CM | POA: Diagnosis not present

## 2022-01-14 DIAGNOSIS — F411 Generalized anxiety disorder: Secondary | ICD-10-CM

## 2022-01-14 DIAGNOSIS — Z6841 Body Mass Index (BMI) 40.0 and over, adult: Secondary | ICD-10-CM

## 2022-01-14 MED ORDER — DULOXETINE HCL 30 MG PO CPEP: 30.0000 mg | ORAL_CAPSULE | Freq: Every day | ORAL | 0 refills | Status: AC

## 2022-01-14 MED ORDER — VITAMIN D (ERGOCALCIFEROL) 1.25 MG (50000 UNIT) PO CAPS: ORAL_CAPSULE | ORAL | 0 refills | Status: AC

## 2022-01-16 ENCOUNTER — Ambulatory Visit (INDEPENDENT_AMBULATORY_CARE_PROVIDER_SITE_OTHER): Payer: Medicaid Other | Admitting: Family Medicine

## 2022-01-23 NOTE — Progress Notes (Signed)
TeleHealth Visit:  Due to the COVID-19 pandemic, this visit was completed with telemedicine (audio/video) technology to reduce patient and provider exposure as well as to preserve personal protective equipment.   Randy Higgins has verbally consented to this TeleHealth visit. The patient is located at home, the provider is located at the [facility] office. The participants in this visit include the listed provider and patient. The visit was conducted today via video.  Chief Complaint: OBESITY Randy Higgins is here to discuss his progress with his obesity treatment plan along with follow-up of his obesity related diagnoses. Randy Higgins is on practicing portion control and making smarter food choices, such as increasing vegetables and decreasing simple carbohydrates and states he is following his eating plan approximately 0% of the time. Randy Higgins states he is not exercising.  Today's visit was #: 12 Starting weight: 252 LBS Starting date: 05/14/2021  Interim History: Patient is sick today with URI.  Went to the [place] for 7 days and did not eat much.  Still getting same feeling and walk a lot.  No regular exercise back at home.  Was doing Trail walking and skips breakfast.  Subjective:   1. GAD (generalized anxiety disorder) Reports abdominal cramping from increase to duloxetine 30 mg to 60 mg daily.  Mood was stable on 30 mg dose and he is in therapy.  2. Vitamin D deficiency Vitamin D level on 09/03/2021 was 24.7.  Patient is on prescription vitamin D 50,000 IU 2 times weekly.  3. Other hyperlipidemia 09/03/2021, LDL was 189 up from 172 previously.  Patient is not on any lipid-lowering therapy.  Patient is working on decreasing saturated fat intake.  Assessment/Plan:   1. GAD (generalized anxiety disorder) Reduce duloxetine to 30 mg daily.  Decrease- DULoxetine (CYMBALTA) 30 MG capsule; Take 1 capsule (30 mg total) by mouth daily.  Dispense: 30 capsule; Refill: 0  2.  Vitamin D deficiency Recheck vitamin D level in January.  Refill- Vitamin D, Ergocalciferol, (DRISDOL) 1.25 MG (50000 UNIT) CAPS capsule; 1 cap twice week  Dispense: 8 capsule; Refill: 0  3. Other hyperlipidemia Recheck in 1 to 2 months, plan to start a statin if LDL >160.  4. Obesity,current BMI 41.3 Firm up winter plan for exercise. Try to fit breakfast in to include a lean protein.   Randy Higgins is currently in the action stage of change. As such, his goal is to continue with weight loss efforts. He has agreed to practicing portion control and making smarter food choices, such as increasing vegetables and decreasing simple carbohydrates.   Exercise goals: All adults should avoid inactivity. Some physical activity is better than none, and adults who participate in any amount of physical activity gain some health benefits.  Behavioral modification strategies: increasing lean protein intake, increasing water intake, no skipping meals, meal planning and cooking strategies, keeping healthy foods in the home, holiday eating strategies , and decreasing junk food.  Randy Higgins has agreed to follow-up with our clinic in 4 weeks. He was informed of the importance of frequent follow-up visits to maximize his success with intensive lifestyle modifications for his multiple health conditions.  Objective:   VITALS: Per patient if applicable, see vitals. GENERAL: Alert and in no acute distress. CARDIOPULMONARY: No increased WOB. Speaking in clear sentences.  PSYCH: Pleasant and cooperative. Speech normal rate and rhythm. Affect is appropriate. Insight and judgement are appropriate. Attention is focused, linear, and appropriate.  NEURO: Oriented as arrived to appointment on time with no prompting.  Lab Results  Component Value Date   CREATININE 0.95 09/03/2021   BUN 11 09/03/2021   NA 141 09/03/2021   K 4.5 09/03/2021   CL 102 09/03/2021   CO2 22 09/03/2021   Lab Results  Component Value Date    ALT 30 09/03/2021   AST 25 09/03/2021   ALKPHOS 104 09/03/2021   BILITOT 0.4 09/03/2021   Lab Results  Component Value Date   HGBA1C 4.6 (L) 09/03/2021   HGBA1C 4.8 05/14/2021   Lab Results  Component Value Date   INSULIN 2.1 (L) 09/03/2021   INSULIN 13.1 05/14/2021   Lab Results  Component Value Date   TSH 1.090 05/14/2021   Lab Results  Component Value Date   CHOL 244 (H) 09/03/2021   HDL 42 09/03/2021   LDLCALC 189 (H) 09/03/2021   TRIG 74 09/03/2021   CHOLHDL 5.8 (H) 09/03/2021   Lab Results  Component Value Date   VD25OH 24.7 (L) 09/03/2021   VD25OH 9.4 (L) 05/14/2021   No results found for: "WBC", "HGB", "HCT", "MCV", "PLT" No results found for: "IRON", "TIBC", "FERRITIN"  Attestation Statements:   Reviewed by clinician on day of visit: allergies, medications, problem list, medical history, surgical history, family history, social history, and previous encounter notes.  I, Malcolm Metro, am acting as Energy manager for Seymour Bars, DO.  I have reviewed the above documentation for accuracy and completeness, and I agree with the above. Glennis Brink, DO

## 2022-02-11 ENCOUNTER — Ambulatory Visit (INDEPENDENT_AMBULATORY_CARE_PROVIDER_SITE_OTHER): Payer: Medicaid Other | Admitting: Adult Health

## 2022-02-23 IMAGING — CR DG CHEST 2V
2 series · 2 of 2 positions shown · non-contrast
Comparison: None.

CLINICAL DATA: Cough.

EXAM:
CHEST - 2 VIEW

[w chest pa]
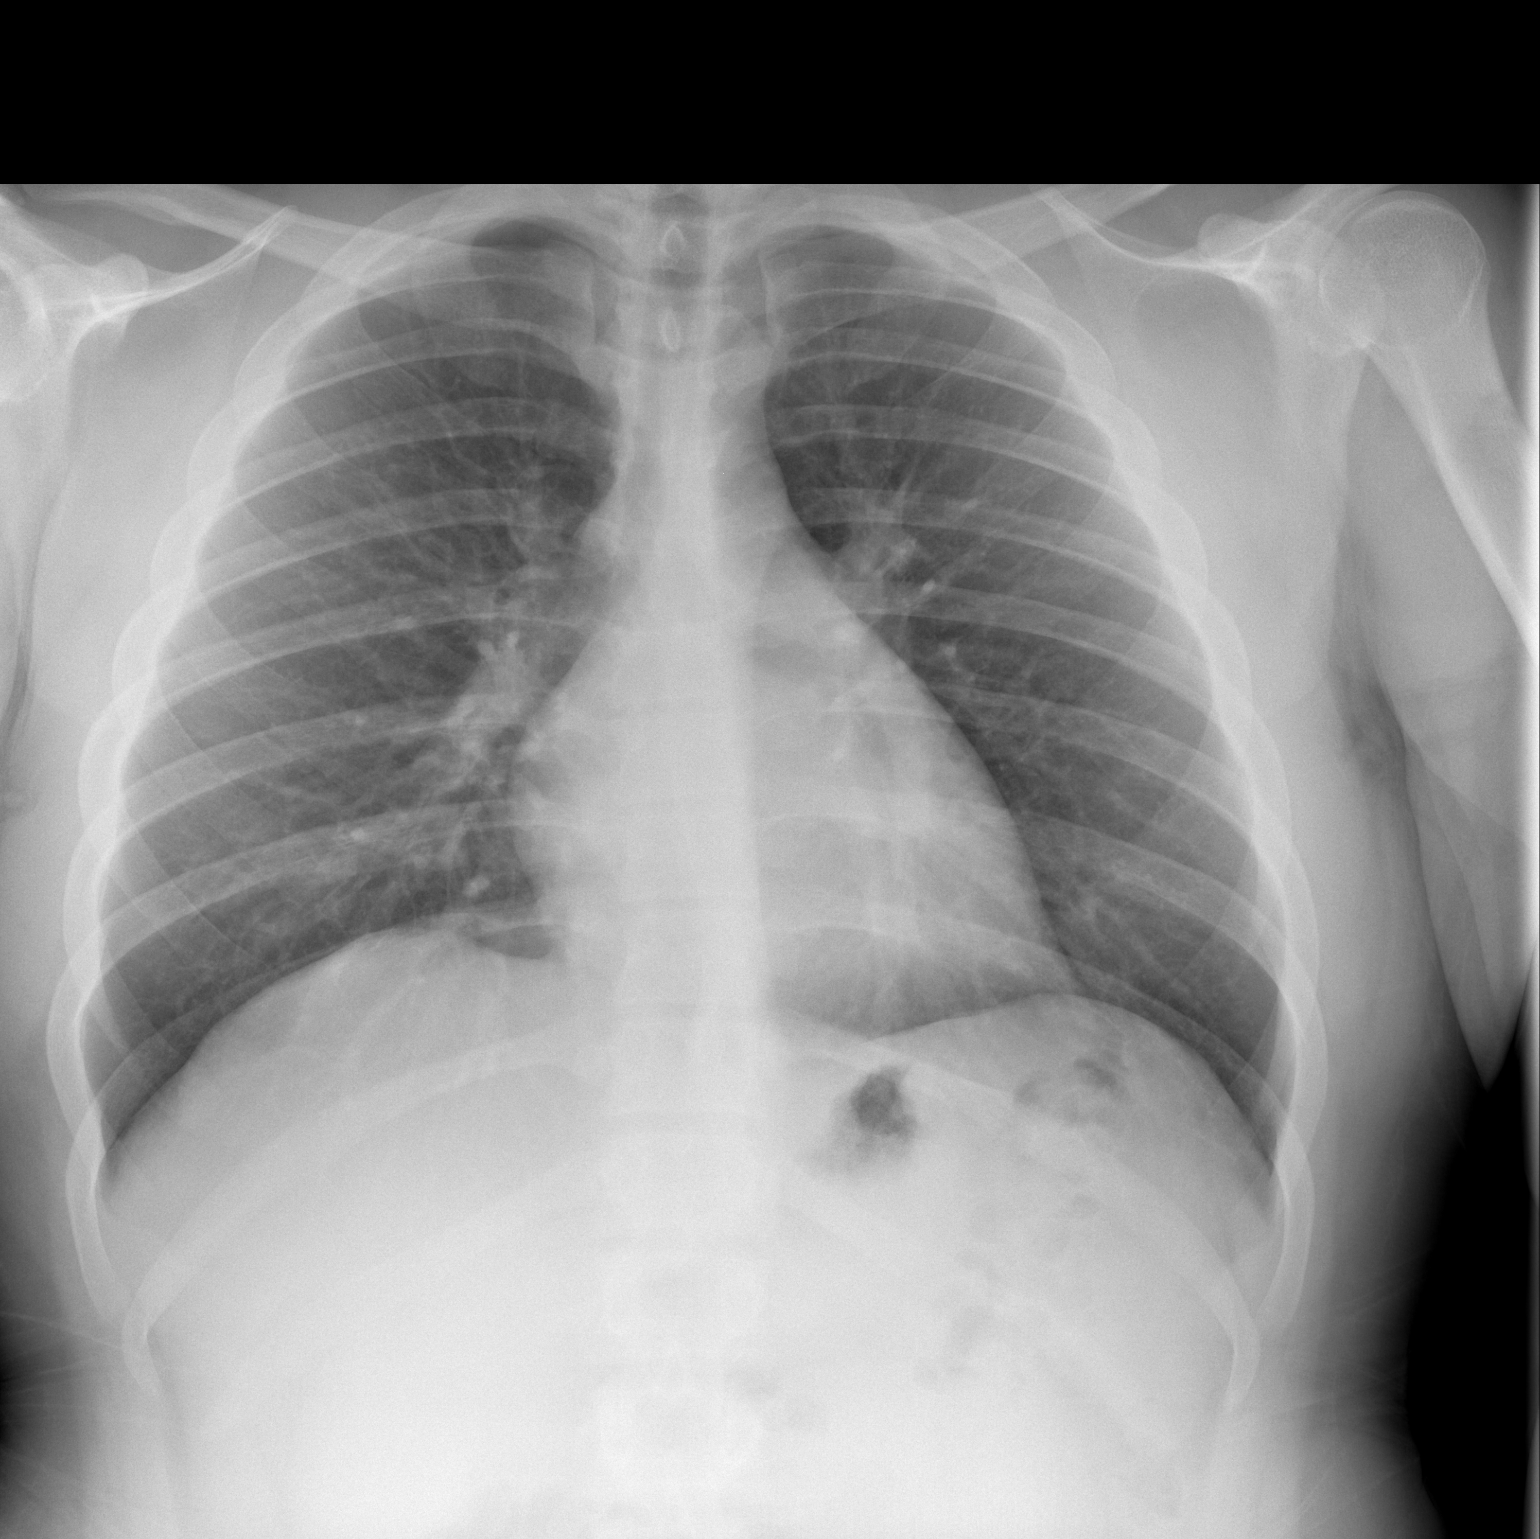

[w chest lat]
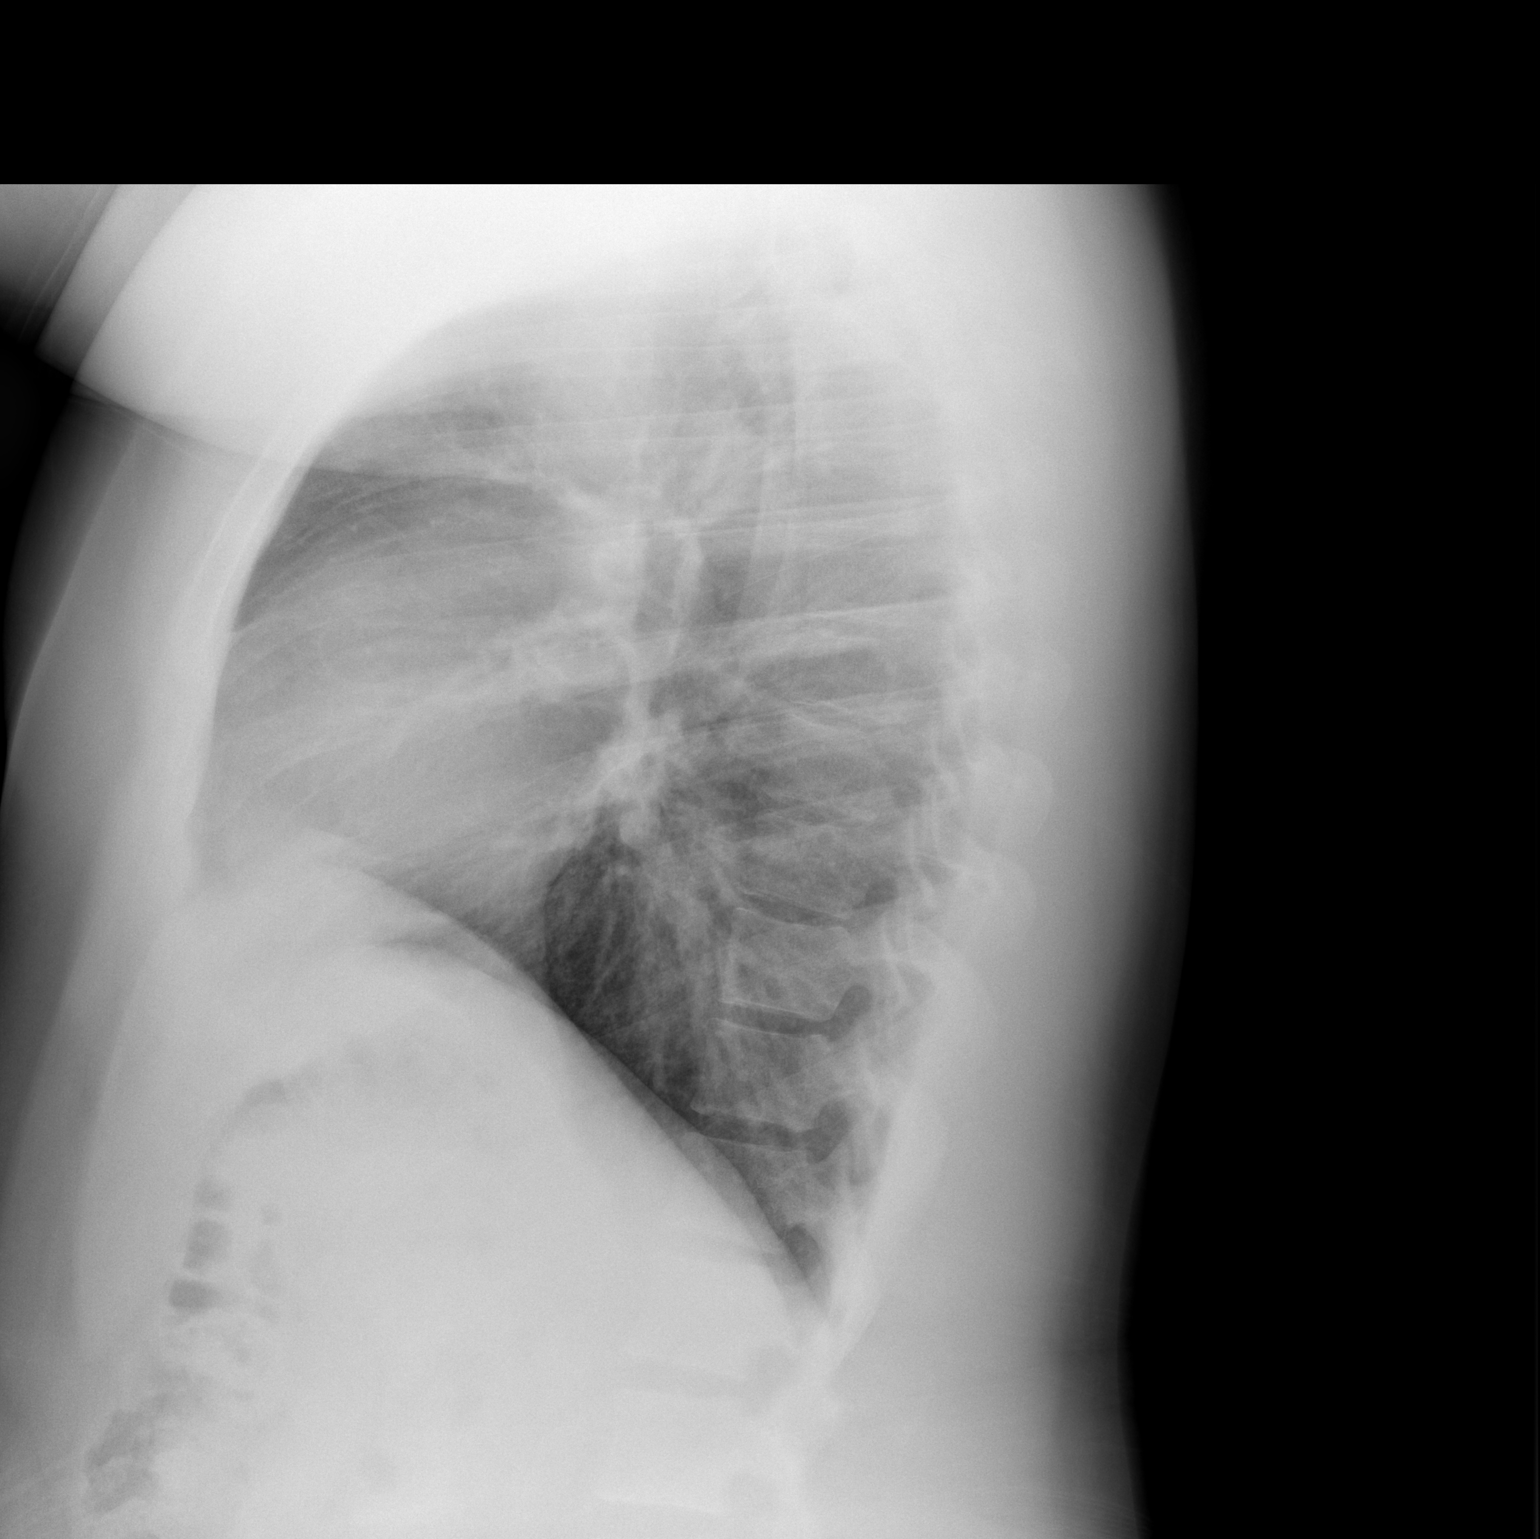

[2 of 2 positions shown; findings below may reference images not displayed]

FINDINGS: The heart size and mediastinal contours are within normal limits.
Both lungs are clear. The visualized skeletal structures are
unremarkable.
IMPRESSION: No active cardiopulmonary disease.
